# Patient Record
Sex: Male | Born: 1995 | Race: Black or African American | Hispanic: No | Marital: Single | Smoking: Never smoker
Health system: Southern US, Community
[De-identification: ages and names within clinical notes are randomized; demographics above are authoritative.]

## PROBLEM LIST (undated history)

## (undated) ENCOUNTER — Emergency Department (HOSPITAL_COMMUNITY): Payer: Self-pay

## (undated) DIAGNOSIS — J45909 Unspecified asthma, uncomplicated: Secondary | ICD-10-CM

---
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
## 2018-07-28 ENCOUNTER — Other Ambulatory Visit: Payer: Self-pay

## 2018-07-28 ENCOUNTER — Encounter (HOSPITAL_BASED_OUTPATIENT_CLINIC_OR_DEPARTMENT_OTHER): Payer: Self-pay | Admitting: Emergency Medicine

## 2018-07-28 ENCOUNTER — Emergency Department (HOSPITAL_BASED_OUTPATIENT_CLINIC_OR_DEPARTMENT_OTHER)
Admission: EM | Admit: 2018-07-28 | Discharge: 2018-07-28 | Disposition: A | Discharge status: Home / Self Care | Payer: BLUE CROSS/BLUE SHIELD | Attending: Emergency Medicine | Admitting: Emergency Medicine

## 2018-07-28 DIAGNOSIS — L723 Sebaceous cyst: Secondary | ICD-10-CM | POA: Insufficient documentation

## 2018-07-28 DIAGNOSIS — L729 Follicular cyst of the skin and subcutaneous tissue, unspecified: Secondary | ICD-10-CM

## 2018-07-28 DIAGNOSIS — J45909 Unspecified asthma, uncomplicated: Secondary | ICD-10-CM | POA: Insufficient documentation

## 2018-07-28 NOTE — Discharge Instructions (Addendum)
I have listed the information below to the dermatologist.  Please call to be further evaluated.  Return to the ER if you have any new or concerning symptoms like pain over the bumps or fever.

## 2018-07-28 NOTE — ED Triage Notes (Signed)
Pt states he has 2 lumps on his head that need to be checked.

## 2018-07-28 NOTE — ED Notes (Signed)
Pt/family verbalized understanding of discharge instructions.   

## 2018-07-28 NOTE — ED Provider Notes (Signed)
[facility] EMERGENCY DEPARTMENT Provider Note   CSN: [id number] Arrival date & time: 07/28/18  1235     History   Chief Complaint Chief Complaint  Patient presents with  . head pain    HPI Curtis Paul is a 22 y.o. male.  HPI   Curtis Paul is a 22 year old male with a history of asthma who presents to the emergency department for evaluation of 2 bumps on the top of his head.  Patient reports that one of the bumps developed about 3 months ago, and the other developed about 2 weeks ago.  The bumps are painless and not pruritic.  He denies history of this in the past.  States that he occasionally gets headaches which she believes is related to the bumps.  No headache currently.  Denies fevers, chills, drainage, neck pain, bumps or growths elsewhere.  Past Medical History:  Diagnosis Date  . Asthma     There are no active problems to display for this patient.   History reviewed. No pertinent surgical history.      Home Medications    Prior to Admission medications   Not on File    Family History No family history on file.  Social History Social History   Tobacco Use  . Smoking status: Never Smoker  . Smokeless tobacco: Never Used  Substance Use Topics  . Alcohol use: No  . Drug use: Yes    Types: Marijuana     Allergies   Shellfish allergy   Review of Systems Review of Systems  Constitutional: Negative for chills and fever.  Musculoskeletal: Negative for neck pain.  Skin: Negative for color change, rash and wound.       Two painless bumps on the scalp  Neurological: Negative for headaches.     Physical Exam Updated Vital Signs BP (!) 145/76 (BP Location: Left Arm)   Pulse 64   Temp 98.2 F (36.8 C) (Oral)   Resp 16   Ht 5\' 10"  (1.778 m)   Wt 78.9 kg   SpO2 100%   BMI 24.97 kg/m   Physical Exam  Constitutional: He appears well-developed and well-nourished. No distress.  No acute distress.  HENT:  Head: Normocephalic  and atraumatic.  Two 1.5cm raised skin-colored soft nodules on the top of the scalp which are non-tender to the touch. No hair growth overlying the nodules. No drainage, erythema or warmth.   Eyes: Right eye exhibits no discharge. Left eye exhibits no discharge.  Neck: Normal range of motion. Neck supple.  Pulmonary/Chest: Effort normal. No respiratory distress.  Lymphadenopathy:    He has no cervical adenopathy.  Neurological: He is alert. Coordination normal.  Skin: Skin is warm and dry. He is not diaphoretic.  Psychiatric: He has a normal mood and affect. His behavior is normal.  Nursing note and vitals reviewed.        ED Treatments / Results  Labs (all labs ordered are listed, but only abnormal results are displayed) Labs Reviewed - No data to display  EKG None  Radiology No results found.  Procedures Procedures (including critical care time)  Medications Ordered in ED Medications - No data to display   Initial Impression / Assessment and Plan / ED Course  I have reviewed the triage vital signs and the nursing notes.  Pertinent labs & imaging results that were available during my care of the patient were reviewed by me and considered in my medical decision making (see chart for details).  Nodules appear to be epidermoid cysts.  No pain, erythema or warmth to suggest infected cyst or abscess.  Have given patient information to follow-up with dermatology.  Counseled him on return precautions and he agrees and appears reliable.  Final Clinical Impressions(s) / ED Diagnoses   Final diagnoses:  Scalp cyst    ED Discharge Orders    None       Lawrence Marseilles 07/28/18 1418    Mesner, Barbara Cower, MD 07/28/18 [phone]

## 2018-08-02 ENCOUNTER — Other Ambulatory Visit: Payer: Self-pay

## 2018-08-02 ENCOUNTER — Emergency Department (HOSPITAL_BASED_OUTPATIENT_CLINIC_OR_DEPARTMENT_OTHER): Payer: Self-pay

## 2018-08-02 ENCOUNTER — Emergency Department (HOSPITAL_BASED_OUTPATIENT_CLINIC_OR_DEPARTMENT_OTHER)
Admission: EM | Admit: 2018-08-02 | Discharge: 2018-08-02 | Disposition: A | Discharge status: Home / Self Care | Payer: Self-pay | Attending: Emergency Medicine | Admitting: Emergency Medicine

## 2018-08-02 ENCOUNTER — Encounter (HOSPITAL_BASED_OUTPATIENT_CLINIC_OR_DEPARTMENT_OTHER): Payer: Self-pay

## 2018-08-02 DIAGNOSIS — J069 Acute upper respiratory infection, unspecified: Secondary | ICD-10-CM | POA: Insufficient documentation

## 2018-08-02 DIAGNOSIS — J45909 Unspecified asthma, uncomplicated: Secondary | ICD-10-CM | POA: Insufficient documentation

## 2018-08-02 DIAGNOSIS — R05 Cough: Secondary | ICD-10-CM | POA: Insufficient documentation

## 2018-08-02 LAB — GROUP A STREP BY PCR: Group A Strep by PCR: NOT DETECTED

## 2018-08-02 MED ORDER — FLUTICASONE PROPIONATE 50 MCG/ACT NA SUSP: 1.0000 | Freq: Every day | NASAL | 0 refills | Status: DC

## 2018-08-02 MED ORDER — IBUPROFEN 800 MG PO TABS: 800.0000 mg | ORAL_TABLET | Freq: Three times a day (TID) | ORAL | 0 refills | Status: DC | PRN

## 2018-08-02 MED ORDER — BENZONATATE 100 MG PO CAPS: 100.0000 mg | ORAL_CAPSULE | Freq: Three times a day (TID) | ORAL | 0 refills | Status: DC

## 2018-08-02 NOTE — ED Provider Notes (Signed)
[facility] EMERGENCY DEPARTMENT Provider Note   CSN: [id number] Arrival date & time: 08/02/18  1255   History   Chief Complaint Chief Complaint  Patient presents with  . URI    HPI Curtis Paul is a 22 y.o. male with a hx of asthma who presents to the ED with complaints of URI symptoms for the past 2 days.  Patient states he is having congestion, ear pressure, sore throat, productive cough with green mucus sputum.  Symptoms have been fairly constant without specific alleviating or aggravating factors.  He does report subjective fevers over the past 2 days, resolved at present.  No interventions prior to arrival.  Denies dyspnea, wheezing, trouble swallowing, vomiting, or chest pain.  Denies recent tick exposures.  HPI  Past Medical History:  Diagnosis Date  . Asthma     There are no active problems to display for this patient.   History reviewed. No pertinent surgical history.      Home Medications    Prior to Admission medications   Not on File    Family History No family history on file.  Social History Social History   Tobacco Use  . Smoking status: Never Smoker  . Smokeless tobacco: Never Used  Substance Use Topics  . Alcohol use: No  . Drug use: Yes    Types: Marijuana     Allergies   Shellfish allergy   Review of Systems Review of Systems  Constitutional: Positive for fever (Subjective resolved at present.).  HENT: Positive for congestion, ear pain, rhinorrhea and sore throat. Negative for trouble swallowing and voice change.   Respiratory: Positive for cough. Negative for shortness of breath, wheezing and stridor.   Cardiovascular: Negative for chest pain.  Gastrointestinal: Negative for nausea and vomiting.  Skin: Negative for rash.     Physical Exam Updated Vital Signs BP 134/70 (BP Location: Left Arm)   Pulse 70   Temp 98.5 F (36.9 C) (Oral)   Resp 18   Ht 5\' 10"  (1.778 m)   Wt 79.8 kg   SpO2 99%   BMI 25.25 kg/m     Physical Exam  Constitutional: He appears well-developed and well-nourished.  Non-toxic appearance. No distress.  HENT:  Head: Normocephalic and atraumatic.  Right Ear: Tympanic membrane and ear canal normal. Tympanic membrane is not perforated, not erythematous, not retracted and not bulging.  Left Ear: Tympanic membrane and ear canal normal. Tympanic membrane is not perforated, not erythematous, not retracted and not bulging.  Nose: Mucosal edema present. Right sinus exhibits no maxillary sinus tenderness and no frontal sinus tenderness. Left sinus exhibits no maxillary sinus tenderness and no frontal sinus tenderness.  Mouth/Throat: Uvula is midline. Posterior oropharyngeal erythema present. No oropharyngeal exudate.  Posterior oropharynx is symmetric.  Patient is tolerating his own secretions without difficulty.  No trismus.  No drooling.  No hot potato voice.  Submandibular compartment is soft.  Eyes: Conjunctivae are normal. Right eye exhibits no discharge. Left eye exhibits no discharge.  Neck: Normal range of motion. Neck supple. No neck rigidity. No edema and no erythema present.  Cardiovascular: Normal rate and regular rhythm.  Pulmonary/Chest: Effort normal and breath sounds normal. No respiratory distress. He has no wheezes. He has no rhonchi. He has no rales.  Respiration even and unlabored  Abdominal: Soft. He exhibits no distension. There is no tenderness.  Lymphadenopathy:    He has cervical adenopathy (Mild anterior).  Neurological: He is alert.  Clear speech.   Skin: Skin  is warm and dry. No rash noted.  Psychiatric: He has a normal mood and affect. His behavior is normal. Thought content normal.  Nursing note and vitals reviewed.    ED Treatments / Results  Labs (all labs ordered are listed, but only abnormal results are displayed) Labs Reviewed  GROUP A STREP BY PCR    EKG None  Radiology Dg Chest 2 View  Result Date: 08/02/2018 CLINICAL DATA:  Fever,  cough, and sore throat. EXAM: CHEST - 2 VIEW COMPARISON:  Chest x-ray report dated November 09, 2008. FINDINGS: The cardiomediastinal silhouette is normal in size. Normal pulmonary vascularity. No focal consolidation, pleural effusion, or pneumothorax. No acute osseous abnormality. IMPRESSION: Normal chest. Electronically Signed   By: Obie DredgeWilliam T Derry M.D.   On: 08/02/2018 13:56    Procedures Procedures (including critical care time)  Medications Ordered in ED Medications - No data to display   Initial Impression / Assessment and Plan / ED Course  I have reviewed the triage vital signs and the nursing notes.  Pertinent labs & imaging results that were available during my care of the patient were reviewed by me and considered in my medical decision making (see chart for details).    Patient presents with URI type symptoms.  Patient is nontoxic appearing, in no apparent distress, vitals are WNL. Patient is afebrile in the ED, lungs are CTA, CXR negative for infiltrate, doubt pneumonia. There is no wheezing or signs of respiratory distress. Sxs onset < 7 days, afebrile, no sinus tenderness, doubt acute bacterial sinusitis. Strep negative, no evidence of RPA/PTA. No evidence of AOM on exam. No meningeal signs. No history components or rashes to raise concern for tic borne illness. Suspect viral vs allergic etiology at this time and will treat supportively with Ibuprofen, Flonase, and Tessalon. I discussed results, treatment plan, need for PCP follow-up, and return precautions with the patient. Provided opportunity for questions, patient confirmed understanding and is in agreement with plan.    Final Clinical Impressions(s) / ED Diagnoses   Final diagnoses:  URI with cough and congestion    ED Discharge Orders         Ordered    ibuprofen (ADVIL,MOTRIN) 800 MG tablet  Every 8 hours PRN     08/02/18 1429    fluticasone (FLONASE) 50 MCG/ACT nasal spray  Daily     08/02/18 1429    benzonatate  (TESSALON) 100 MG capsule  Every 8 hours     08/02/18 1429           Terryl Niziolek, Pleas KochSamantha R, PA-C 08/02/18 1430    Benjiman CorePickering, Nathan, MD 08/02/18 1546

## 2018-08-02 NOTE — Discharge Instructions (Signed)
You were seen in the emergency today for upper respiratory symptoms, your strep test was negative, your chest x-ray was normal, we suspect your symptoms are related to allergies or a virus at this time.  I have prescribed you multiple medications to treat your symptoms.   -Flonase to be used 1 spray in each nostril daily.  This medication is used to treat your congestion.  -Tessalon can be taken once every 8 hours as needed.  This medication is used to treat your cough.  -Ibuprofen to be taken once every 8 hours as needed for pain. Please take this medicine with food as it can cause stomach upset and at worst stomach bleeding. Do not take other NSAIDs such as motrin, aleve, advil, naproxen, mobic, etc as they are similar. You make take tylenol per over the counter dosing with this medicine safely.  We have prescribed you new medication(s) today. Discuss the medications prescribed today with your pharmacist as they can have adverse effects and interactions with your other medicines including over the counter and prescribed medications. Seek medical evaluation if you start to experience new or abnormal symptoms after taking one of these medicines, seek care immediately if you start to experience difficulty breathing, feeling of your throat closing, facial swelling, or rash as these could be indications of a more serious allergic reaction  You will need to follow-up with your primary care provider in 1 week if your symptoms have not improved.  If you do not have a primary care provider one is provided in your discharge instructions.  Return to the emergency department for any new or worsening symptoms including but not limited to persistent fever for 5 days, difficulty breathing, chest pain, rashes, passing out, or any other concerns.

## 2018-08-02 NOTE — ED Triage Notes (Signed)
C/o URI sx x 2 days-NAD-steady gait 

## 2018-10-15 ENCOUNTER — Other Ambulatory Visit: Payer: Self-pay

## 2018-10-15 ENCOUNTER — Emergency Department (HOSPITAL_BASED_OUTPATIENT_CLINIC_OR_DEPARTMENT_OTHER)
Admission: EM | Admit: 2018-10-15 | Discharge: 2018-10-15 | Disposition: A | Discharge status: Home / Self Care | Payer: Self-pay | Attending: Emergency Medicine | Admitting: Emergency Medicine

## 2018-10-15 ENCOUNTER — Encounter (HOSPITAL_BASED_OUTPATIENT_CLINIC_OR_DEPARTMENT_OTHER): Payer: Self-pay | Admitting: *Deleted

## 2018-10-15 DIAGNOSIS — Z79899 Other long term (current) drug therapy: Secondary | ICD-10-CM | POA: Insufficient documentation

## 2018-10-15 DIAGNOSIS — L723 Sebaceous cyst: Secondary | ICD-10-CM | POA: Insufficient documentation

## 2018-10-15 DIAGNOSIS — L729 Follicular cyst of the skin and subcutaneous tissue, unspecified: Secondary | ICD-10-CM

## 2018-10-15 DIAGNOSIS — F17200 Nicotine dependence, unspecified, uncomplicated: Secondary | ICD-10-CM | POA: Insufficient documentation

## 2018-10-15 DIAGNOSIS — J45909 Unspecified asthma, uncomplicated: Secondary | ICD-10-CM | POA: Insufficient documentation

## 2018-10-15 DIAGNOSIS — Z23 Encounter for immunization: Secondary | ICD-10-CM | POA: Insufficient documentation

## 2018-10-15 DIAGNOSIS — R42 Dizziness and giddiness: Secondary | ICD-10-CM | POA: Insufficient documentation

## 2018-10-15 LAB — BASIC METABOLIC PANEL
Anion gap: 4 — ABNORMAL LOW (ref 5–15)
BUN: 11 mg/dL (ref 6–20)
CALCIUM: 9 mg/dL (ref 8.9–10.3)
CO2: 29 mmol/L (ref 22–32)
CREATININE: 0.66 mg/dL (ref 0.61–1.24)
Chloride: 104 mmol/L (ref 98–111)
GFR calc Af Amer: >60 mL/min (ref >60)
GLUCOSE: 88 mg/dL (ref 70–99)
Potassium: 3.6 mmol/L (ref 3.5–5.1)
Sodium: 137 mmol/L (ref 135–145)

## 2018-10-15 LAB — CBC WITH DIFFERENTIAL/PLATELET
Abs Immature Granulocytes: 0.01 10*3/uL (ref 0.00–0.07)
BASOS PCT: 0 %
Basophils Absolute: 0 10*3/uL (ref 0.0–0.1)
EOS ABS: 0.3 10*3/uL (ref 0.0–0.5)
EOS PCT: 3 %
HEMATOCRIT: 41.8 % (ref 39.0–52.0)
Hemoglobin: 13.5 g/dL (ref 13.0–17.0)
IMMATURE GRANULOCYTES: 0 %
LYMPHS ABS: 1.7 10*3/uL (ref 0.7–4.0)
Lymphocytes Relative: 22 %
MCH: 26.8 pg (ref 26.0–34.0)
MCHC: 32.3 g/dL (ref 30.0–36.0)
MCV: 82.9 fL (ref 80.0–100.0)
MONOS PCT: 8 %
Monocytes Absolute: 0.6 10*3/uL (ref 0.1–1.0)
NEUTROS PCT: 67 %
Neutro Abs: 5 10*3/uL (ref 1.7–7.7)
PLATELETS: 160 10*3/uL (ref 150–400)
RBC: 5.04 MIL/uL (ref 4.22–5.81)
RDW: 12.9 % (ref 11.5–15.5)
WBC: 7.6 10*3/uL (ref 4.0–10.5)
nRBC: 0 % (ref 0.0–0.2)

## 2018-10-15 MED ORDER — TETANUS-DIPHTH-ACELL PERTUSSIS 5-2.5-18.5 LF-MCG/0.5 IM SUSP
0.5000 mL | Freq: Once | INTRAMUSCULAR | Status: AC
Start: 2018-10-15 — End: 2018-10-15
  Administered 2018-10-15: 0.5 mL via INTRAMUSCULAR
  Filled 2018-10-15: qty 0.5

## 2018-10-15 MED ORDER — NAPROXEN 500 MG PO TABS: 500.0000 mg | ORAL_TABLET | Freq: Two times a day (BID) | ORAL | 0 refills | Status: DC

## 2018-10-15 MED ORDER — LIDOCAINE-EPINEPHRINE (PF) 2 %-1:200000 IJ SOLN
INTRAMUSCULAR | Status: AC
  Filled 2018-10-15: qty 10

## 2018-10-15 MED ORDER — LIDOCAINE-EPINEPHRINE (PF) 2 %-1:200000 IJ SOLN
20.0000 mL | Freq: Once | INTRAMUSCULAR | Status: AC
  Administered 2018-10-15: 20 mL
  Filled 2018-10-15: qty 20

## 2018-10-15 NOTE — ED Provider Notes (Signed)
[facility] EMERGENCY DEPARTMENT Provider Note   CSN: [id number] Arrival date & time: 10/15/18  4098     History   Chief Complaint Chief Complaint  Patient presents with  . Body aches; bump to head; light headed    HPI Curtis Paul is a 22 y.o. male presenting for evaluation of scalp cysts and body aches.  Patient states he has been having issues with bumps on his scalp for the past several months.  This is the third time he has been in the ER for this.  He was last seen 5 days ago, where 2 of the bumps were drained.  Patient states he has been told there is cysts, and he will need to follow-up with dermatology but he has not done so yet.  Patient states that since his last visit, the bumps are more swollen and tender.  Additionally, he reports in the last week he has been having body aches.  Constant, nothing makes it better or worse.  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  He has not taken anything for pain including Tylenol or ibuprofen.  He has no medical problems, takes no medications daily.  He does not know when his last tetanus shot was.  He is not on any blood thinners. Additionally, patient states he has intermittent lightheadedness, this is usually after a box that he is unloading from a truck hit him on the head.  It lasts for several minutes before resolving.  He never has lightheadedness without head trauma.  He denies loss of consciousness, headaches, vision changes. He denies numbness, tingling, slurred speech.  Pt denies fevers, chills, N/V, st, cough, CP, urinary sxs, or abnromal BMs.   HPI  Past Medical History:  Diagnosis Date  . Asthma     There are no active problems to display for this patient.   History reviewed. No pertinent surgical history.      Home Medications    Prior to Admission medications   Medication Sig Start Date End Date Taking? Authorizing Provider  benzonatate (TESSALON) 100 MG capsule Take 1 capsule (100  mg total) by mouth every 8 (eight) hours. 08/02/18   Petrucelli, Samantha R, PA-C  fluticasone (FLONASE) 50 MCG/ACT nasal spray Place 1 spray into both nostrils daily. 08/02/18   Petrucelli, Samantha R, PA-C  ibuprofen (ADVIL,MOTRIN) 800 MG tablet Take 1 tablet (800 mg total) by mouth every 8 (eight) hours as needed. 08/02/18   Petrucelli, Samantha R, PA-C  naproxen (NAPROSYN) 500 MG tablet Take 1 tablet (500 mg total) by mouth 2 (two) times daily with a meal. 10/15/18   Caralina Nop, PA-C    Family History History reviewed. No pertinent family history.  Social History Social History   Tobacco Use  . Smoking status: Current Every Day Smoker  . Smokeless tobacco: Never Used  . Tobacco comment: Black and Mouse  Substance Use Topics  . Alcohol use: No  . Drug use: Yes    Types: Marijuana     Allergies   Shellfish allergy   Review of Systems Review of Systems  Musculoskeletal: Positive for myalgias.  Skin:       Lesions on scalp  Neurological: Positive for light-headedness (with head trauma).  All other systems reviewed and are negative.    Physical Exam Updated Vital Signs BP (!) 150/83 (BP Location: Right Arm)   Pulse (!) 54   Temp 98.6 F (37 C) (Oral)   Resp 16   Ht 5'  10" (1.778 m)   Wt 65.8 kg   SpO2 100%   BMI 20.81 kg/m   Physical Exam  Constitutional: He is oriented to person, place, and time. He appears well-developed and well-nourished. No distress.  Sitting comfortably in the bed in no acute distress  HENT:  Head: Normocephalic and atraumatic.    3 soft and fluctuant areas on the scalp, consistent with previous pictures.  No hair growth overlying the cysts.  Clear fluid can be expressed from the top lesion.  Signs of previous I&D's, but no different drainage.  No erythema or warmth.  No significant tenderness.  Eyes: Pupils are equal, round, and reactive to light. Conjunctivae and EOM are normal.  Neck: Normal range of motion. Neck supple.    Cardiovascular: Normal rate, regular rhythm and intact distal pulses.  Pulmonary/Chest: Effort normal and breath sounds normal. No respiratory distress. He has no wheezes.  Abdominal: Soft. He exhibits no distension. There is no tenderness.  Musculoskeletal: Normal range of motion. He exhibits no edema, tenderness or deformity.  No focal tenderness, swelling or deformity. Strength intact x4, sensation intact x4  Neurological: He is alert and oriented to person, place, and time. No sensory deficit.  Skin: Skin is warm and dry. Capillary refill takes less than 2 seconds.  Psychiatric: He has a normal mood and affect.  Nursing note and vitals reviewed.    ED Treatments / Results  Labs (all labs ordered are listed, but only abnormal results are displayed) Labs Reviewed  BASIC METABOLIC PANEL - Abnormal; Notable for the following components:      Result Value   Anion gap 4 (*)    All other components within normal limits  CBC WITH DIFFERENTIAL/PLATELET    EKG None  Radiology No results found.  Procedures .Marland KitchenIncision and Drainage Date/Time: 10/15/2018 1:28 PM Performed by: Alveria Apley, PA-C Authorized by: Alveria Apley, PA-C   Consent:    Consent obtained:  Verbal   Consent given by:  Patient   Risks discussed:  Bleeding, incomplete drainage, pain and infection Location:    Type:  Cyst   Size:  1x1 cm   Location:  Head   Head location:  Scalp (crown) Pre-procedure details:    Skin preparation:  Chloraprep Anesthesia (see MAR for exact dosages):    Anesthesia method:  Local infiltration   Local anesthetic:  Lidocaine 2% WITH epi Procedure type:    Complexity:  Simple Procedure details:    Incision types:  Single straight   Incision depth:  Dermal   Scalpel blade:  11   Wound management:  Probed and deloculated   Drainage:  Serosanguinous   Drainage amount:  Scant   Wound treatment:  Wound left open   Packing materials:  None Post-procedure details:     Patient tolerance of procedure:  Tolerated well, no immediate complications .Marland KitchenIncision and Drainage Date/Time: 10/15/2018 1:28 PM Performed by: Alveria Apley, PA-C Authorized by: Alveria Apley, PA-C   Consent:    Consent obtained:  Verbal   Consent given by:  Patient   Risks discussed:  Bleeding, incomplete drainage, infection and pain Location:    Type:  Cyst   Size:  0.5x0.5 cm   Location:  Head   Head location:  Scalp (L parieto-occipital head) Pre-procedure details:    Skin preparation:  Chloraprep Anesthesia (see MAR for exact dosages):    Anesthesia method:  Local infiltration   Local anesthetic:  Lidocaine 2% WITH epi Procedure type:    Complexity:  Simple  Procedure details:    Incision types:  Single straight   Incision depth:  Dermal   Scalpel blade:  11   Wound management:  Probed and deloculated   Drainage:  Serosanguinous   Drainage amount:  Scant   Wound treatment:  Wound left open   Packing materials:  None Post-procedure details:    Patient tolerance of procedure:  Tolerated well, no immediate complications .Marland Kitchen.Incision and Drainage Date/Time: 10/15/2018 1:29 PM Performed by: Alveria Apleyaccavale, Unnamed Zeien, PA-C Authorized by: Alveria Apleyaccavale, Kaysie Michelini, PA-C   Consent:    Consent obtained:  Verbal   Consent given by:  Patient   Risks discussed:  Bleeding, incomplete drainage, infection and pain Location:    Type:  Cyst   Size:  1.5x1 cm   Location:  Head   Head location:  Scalp (L occipital head) Pre-procedure details:    Skin preparation:  Chloraprep Anesthesia (see MAR for exact dosages):    Anesthesia method:  Local infiltration   Local anesthetic:  Lidocaine 2% WITH epi Procedure type:    Complexity:  Simple Procedure details:    Incision types:  Single straight   Incision depth:  Dermal   Scalpel blade:  11   Wound management:  Probed and deloculated   Drainage:  Serosanguinous   Drainage amount:  Moderate   Wound treatment:  Wound left open    Packing materials:  None Post-procedure details:    Patient tolerance of procedure:  Tolerated well, no immediate complications   (including critical care time)  Medications Ordered in ED Medications  lidocaine-EPINEPHrine (XYLOCAINE W/EPI) 2 %-1:200000 (PF) injection (has no administration in time range)  lidocaine-EPINEPHrine (XYLOCAINE W/EPI) 2 %-1:200000 (PF) injection 20 mL (20 mLs Infiltration Given by Other 10/15/18 1031)  Tdap (BOOSTRIX) injection 0.5 mL (0.5 mLs Intramuscular Given 10/15/18 1032)     Initial Impression / Assessment and Plan / ED Course  I have reviewed the triage vital signs and the nursing notes.  Pertinent labs & imaging results that were available during my care of the patient were reviewed by me and considered in my medical decision making (see chart for details).     Patient presenting for evaluation of cysts on his head, body aches, and lightheadedness.  Physical exam reassuring, no obvious signs of infection.  He is afebrile not tachycardic.  Appears nontoxic.  Lesions on the head not appear infected.  They are soft and nontender.  Lightheadedness is associated with head trauma, no lightheadedness now. Discussed with pt that repetitive head trauma can be dangerous over long-term, and he needs to find a way to not sustain head trauma.  Currently without neurologic deficits, I do not believe he needs CT scan at this time.  As patient is having generous body aches, will obtain basic labs to assess for hemoglobin and electrolyte stability.  Low suspicion for infection, as patient without infectious symptoms.  I and D performed as described above.  No purjob.  Encouraged follow-up with dermatology.  Encourulent drainage, normal serosanguineous.  Doubt infected cysts.  Discussed with patient that he needs to follow-up with dermatology for further management. Labs reassuring, no leukocytosis.  hgb and electrolytes stable.  Myalgias may be due to likely demanding aged  Tylenol and ibuprofen for pain and swelling.  At this time, patient received discharge.  Return precautions given.  Patient states he understands and agrees to plan.   Final Clinical Impressions(s) / ED Diagnoses   Final diagnoses:  Scalp cyst    ED Discharge Orders  Ordered    naproxen (NAPROSYN) 500 MG tablet  2 times daily with meals     10/15/18 1135           Janye Maynor, PA-C 10/15/18 1330    Gwyneth Sprout, MD 10/15/18 [phone]

## 2018-10-15 NOTE — ED Notes (Signed)
Patient has 3 bumps to his head. 1 bump to top of his head is draining a small amount of clear drainage.  Bumps is tender.  As per conversation, patient stated that his mom also have the same bumps that he have.

## 2018-10-15 NOTE — ED Notes (Signed)
ED Provider at bedside. 

## 2018-10-15 NOTE — ED Triage Notes (Signed)
Generalized body aches for a week; cysts to head, [facility] and has been light headed for a week intermittently.

## 2018-10-15 NOTE — Discharge Instructions (Addendum)
For pain and swelling, take naproxen 2 times a day with meals.  Do not take other anti-inflammatories at the same time (Advil, Motrin, naproxen, Aleve). You may supplement with Tylenol if you need further pain control. Use ice packs to help with swelling.  Wash the areas on your head daily with soap and water.  It may continue to drain over the next several days, keep covered while draining. It is very important to follow-up with dermatology for further management of the spots in your head. 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  It is dangerous to have repetitive head trauma. Return to the emergency room with any new, worsening, concerning symptoms.

## 2018-12-18 ENCOUNTER — Emergency Department (HOSPITAL_BASED_OUTPATIENT_CLINIC_OR_DEPARTMENT_OTHER)
Admission: EM | Admit: 2018-12-18 | Discharge: 2018-12-18 | Disposition: A | Discharge status: Home / Self Care | Payer: Self-pay | Attending: Emergency Medicine | Admitting: Emergency Medicine

## 2018-12-18 ENCOUNTER — Other Ambulatory Visit: Payer: Self-pay

## 2018-12-18 ENCOUNTER — Emergency Department (HOSPITAL_BASED_OUTPATIENT_CLINIC_OR_DEPARTMENT_OTHER): Payer: Self-pay

## 2018-12-18 ENCOUNTER — Encounter (HOSPITAL_BASED_OUTPATIENT_CLINIC_OR_DEPARTMENT_OTHER): Payer: Self-pay | Admitting: Emergency Medicine

## 2018-12-18 DIAGNOSIS — R05 Cough: Secondary | ICD-10-CM | POA: Insufficient documentation

## 2018-12-18 DIAGNOSIS — F17200 Nicotine dependence, unspecified, uncomplicated: Secondary | ICD-10-CM | POA: Insufficient documentation

## 2018-12-18 DIAGNOSIS — J45909 Unspecified asthma, uncomplicated: Secondary | ICD-10-CM | POA: Insufficient documentation

## 2018-12-18 DIAGNOSIS — R0602 Shortness of breath: Secondary | ICD-10-CM | POA: Insufficient documentation

## 2018-12-18 DIAGNOSIS — R0789 Other chest pain: Secondary | ICD-10-CM | POA: Insufficient documentation

## 2018-12-18 MED ORDER — IPRATROPIUM-ALBUTEROL 0.5-2.5 (3) MG/3ML IN SOLN
3.0000 mL | Freq: Four times a day (QID) | RESPIRATORY_TRACT | Status: DC
  Administered 2018-12-18: 3 mL via RESPIRATORY_TRACT
  Filled 2018-12-18: qty 3

## 2018-12-18 MED ORDER — ALBUTEROL SULFATE HFA 108 (90 BASE) MCG/ACT IN AERS: 1.0000 | INHALATION_SPRAY | Freq: Four times a day (QID) | RESPIRATORY_TRACT | 0 refills | Status: DC | PRN

## 2018-12-18 NOTE — ED Triage Notes (Signed)
Pt here with SOB after smoking his black and mild today.

## 2018-12-18 NOTE — Discharge Instructions (Signed)
Chest x-ray was normal.  EKG was normal.  We suspect that your symptoms were asthma related.  Please do not smoke tobacco products as this can aggravate your asthma.  We are sending you home with an inhaler to use as needed for shortness of breath or wheezing.  Use 1 to 2 puffs every 4-6 hours.  We have prescribed you new medication(s) today. Discuss the medications prescribed today with your pharmacist as they can have adverse effects and interactions with your other medicines including over the counter and prescribed medications. Seek medical evaluation if you start to experience new or abnormal symptoms after taking one of these medicines, seek care immediately if you start to experience difficulty breathing, feeling of your throat closing, facial swelling, or rash as these could be indications of a more serious allergic reaction  Follow with primary care within 1 week.  If you do not have a primary care provider please see the attached phone number circled.  Return to the ER for new or worsening symptoms or any other concerns.

## 2018-12-18 NOTE — ED Provider Notes (Signed)
[facility] EMERGENCY DEPARTMENT Provider Note   CSN: [id number] Arrival date & time: 12/18/18  1255     History   Chief Complaint Chief Complaint  Patient presents with  . Shortness of Breath    HPI Curtis Paul is a 23 y.o. male with a hx of tobacco abuse and asthma who presents to the ED with complaints of dyspnea that started shortly PTA.  Patient states he had a dry cough with some chest tightness over the past few days.  He states that today he smoked a black and mild which acutely worsened his chest tightness, cough, and caused him to develop shortness of breath.  States that since arrival to the emergency department his symptoms are overall improving.  He does not really feel short of breath anymore but remains with some mild chest tightness.  Other than smoking the back and mild no specific alleviating or aggravating factors.  No meds tried prior to arrival.  He does have a history of asthma and does not have an inhaler to use at home.  He denies fever, chills, lightheadedness, dizziness, nausea, vomiting, diaphoresis, or syncope.  HPI  Past Medical History:  Diagnosis Date  . Asthma     There are no active problems to display for this patient.   History reviewed. No pertinent surgical history.      Home Medications    Prior to Admission medications   Medication Sig Start Date End Date Taking? Authorizing Provider  benzonatate (TESSALON) 100 MG capsule Take 1 capsule (100 mg total) by mouth every 8 (eight) hours. 08/02/18   Gustavia Carie R, PA-C  fluticasone (FLONASE) 50 MCG/ACT nasal spray Place 1 spray into both nostrils daily. 08/02/18   Daryel Kenneth R, PA-C  ibuprofen (ADVIL,MOTRIN) 800 MG tablet Take 1 tablet (800 mg total) by mouth every 8 (eight) hours as needed. 08/02/18   Tracey Stewart R, PA-C  naproxen (NAPROSYN) 500 MG tablet Take 1 tablet (500 mg total) by mouth 2 (two) times daily with a meal. 10/15/18   Caccavale, Sophia,  PA-C    Family History History reviewed. No pertinent family history.  Social History Social History   Tobacco Use  . Smoking status: Current Every Day Smoker  . Smokeless tobacco: Never Used  . Tobacco comment: Black and Mouse  Substance Use Topics  . Alcohol use: No  . Drug use: Yes    Types: Marijuana     Allergies   Shellfish allergy   Review of Systems Review of Systems  Constitutional: Negative for chills, diaphoresis and fever.  HENT: Negative for congestion and sore throat.   Respiratory: Positive for cough, chest tightness and shortness of breath.   Cardiovascular: Negative for palpitations and leg swelling.  Gastrointestinal: Negative for abdominal pain and vomiting.  Neurological: Negative for syncope.     Physical Exam Updated Vital Signs BP (!) 142/74   Pulse 74   Temp 98.2 F (36.8 C) (Oral)   Resp 16   Ht 5\' 10"  (1.778 m)   Wt 68 kg   SpO2 100%   BMI 21.52 kg/m   Physical Exam Vitals signs and nursing note reviewed.  Constitutional:      General: He is not in acute distress.    Appearance: He is well-developed. He is not toxic-appearing.  HENT:     Head: Normocephalic and atraumatic.  Eyes:     General:        Right eye: No discharge.  Left eye: No discharge.     Conjunctiva/sclera: Conjunctivae normal.  Neck:     Musculoskeletal: Neck supple.  Cardiovascular:     Rate and Rhythm: Normal rate and regular rhythm.  Pulmonary:     Effort: Pulmonary effort is normal. No respiratory distress.     Breath sounds: Normal breath sounds. No decreased breath sounds, wheezing, rhonchi or rales.  Chest:     Chest wall: No deformity, tenderness or crepitus.  Abdominal:     General: There is no distension.     Palpations: Abdomen is soft.     Tenderness: There is no abdominal tenderness.  Skin:    General: Skin is warm and dry.     Findings: No rash.  Neurological:     Mental Status: He is alert.     Comments: Clear speech.     Psychiatric:        Behavior: Behavior normal.    ED Treatments / Results  Labs (all labs ordered are listed, but only abnormal results are displayed) Labs Reviewed - No data to display  EKG EKG Interpretation  Date/Time:  Wednesday December 18 2018 13:05:24 EST Ventricular Rate:  71 PR Interval:  142 QRS Duration: 98 QT Interval:  364 QTC Calculation: 395 R Axis:   87 Text Interpretation:  Normal sinus rhythm Normal ECG When compared to prior, no significant changes seen.  No STEMI Confirmed by Theda Belfastegeler, Chris ([id number]) on 12/18/2018 3:22:13 PM   Radiology Dg Chest 2 View  Result Date: 12/18/2018 CLINICAL DATA:  Shortness of breath EXAM: CHEST - 2 VIEW COMPARISON:  August 02, 2018 FINDINGS: Lungs are clear. The heart size and pulmonary vascularity are normal. No adenopathy. No bone lesions. IMPRESSION: No edema or consolidation. Electronically Signed   By: Bretta BangWilliam  Woodruff III M.D.   On: 12/18/2018 13:15    Procedures Procedures (including critical care time)  Medications Ordered in ED Medications - No data to display   Initial Impression / Assessment and Plan / ED Course  I have reviewed the triage vital signs and the nursing notes.  Pertinent labs & imaging results that were available during my care of the patient were reviewed by me and considered in my medical decision making (see chart for details).   Patient presents to the ED with chest tightness/dry cough x 3 days which worsened w/ development of dyspnea after smoking a black and mild today shortly PTA. Nontoxic appearing, in no apparent distress, vitals WNL with the exception of elevated BP- doubt HTN emergency. Exam with clear lungs. CXR per triage negative for infiltrate, pneumothorax, effusion, edema. EKG per triage without arrhythmia or ischemic changes. No EKG changes or hx components to raise concern for pericarditis. Lungs clear without wheezing, but with hx of asthma trial of duoneb- patient reports resolution  in sxs. Do not feel this is true asthma exacerbation requiring steroids, but will provide patient prescription for inhaler as he does not have one at home for PRN use. PCP follow up. I discussed results, treatment plan, need for follow-up, and return precautions with the patient. Provided opportunity for questions, patient confirmed understanding and is in agreement with plan.   Final Clinical Impressions(s) / ED Diagnoses   Final diagnoses:  Shortness of breath    ED Discharge Orders         Ordered    albuterol (PROVENTIL HFA;VENTOLIN HFA) 108 (90 Base) MCG/ACT inhaler  Every 6 hours PRN     12/18/18 1531  Cherly Andersonetrucelli, Koltyn Kelsay R, [place]PA-C 12/18/18 [id number]    Tegeler, Canary Brimhristopher J, MD 12/18/18 [phone]

## 2019-07-24 ENCOUNTER — Encounter (HOSPITAL_BASED_OUTPATIENT_CLINIC_OR_DEPARTMENT_OTHER): Payer: Self-pay | Admitting: Emergency Medicine

## 2019-07-24 ENCOUNTER — Emergency Department (HOSPITAL_BASED_OUTPATIENT_CLINIC_OR_DEPARTMENT_OTHER)
Admission: EM | Admit: 2019-07-24 | Discharge: 2019-07-24 | Disposition: A | Discharge status: Home / Self Care | Payer: Self-pay | Attending: Emergency Medicine | Admitting: Emergency Medicine

## 2019-07-24 ENCOUNTER — Other Ambulatory Visit: Payer: Self-pay

## 2019-07-24 DIAGNOSIS — Z87891 Personal history of nicotine dependence: Secondary | ICD-10-CM | POA: Insufficient documentation

## 2019-07-24 DIAGNOSIS — J029 Acute pharyngitis, unspecified: Secondary | ICD-10-CM | POA: Insufficient documentation

## 2019-07-24 DIAGNOSIS — Z20828 Contact with and (suspected) exposure to other viral communicable diseases: Secondary | ICD-10-CM | POA: Insufficient documentation

## 2019-07-24 DIAGNOSIS — J45909 Unspecified asthma, uncomplicated: Secondary | ICD-10-CM | POA: Insufficient documentation

## 2019-07-24 LAB — GROUP A STREP BY PCR: Group A Strep by PCR: NOT DETECTED

## 2019-07-24 NOTE — ED Triage Notes (Signed)
Sore throat since last night.  Denies any other sx.  Denies fever.

## 2019-07-24 NOTE — Discharge Instructions (Addendum)
You were evaluated in the Emergency Department and after careful evaluation, we did not find any emergent condition requiring admission or further testing in the hospital.  Your symptoms today seem to be due to a virus.  You tested negative for strep throat.  We recommend Tylenol or ibuprofen at home for discomfort.  It is unlikely that your symptoms could be caused by the coronavirus.  We tested you for the coronavirus here in the emergency department, we recommend home isolation/quarantine until your test results.  If positive, we recommend continued home isolation according to [facility] recommendations.  Please return to the Emergency Department if you experience any worsening of your condition.  We encourage you to follow up with a primary care provider.  Thank you for allowing [place] to be a part of your care.

## 2019-07-24 NOTE — ED Notes (Signed)
ED Provider at bedside. 

## 2019-07-24 NOTE — ED Provider Notes (Signed)
[facility] Emergency Department Provider Note MRN:  [id number]  Arrival date & time: 07/24/19     Chief Complaint   Sore Throat   History of Present Illness   Curtis Paul is a 23 y.o. year-old male with a history of asthma presenting to the ED with chief complaint of sore throat.  1 day of sore throat, thinks he has strep throat.  Denies fever, no cough, no shortness of breath, no chest pain.  Symptoms are constant, moderate in severity, worse with swallowing but able to eat and drink today.  Review of Systems  A problem-focused ROS was performed. Positive for sore throat.  Patient denies fever, cough.  Patient's Health History    Past Medical History:  Diagnosis Date  . Asthma     History reviewed. No pertinent surgical history.  No family history on file.  Social History   Socioeconomic History  . Marital status: Single    Spouse name: Not on file  . Number of children: Not on file  . Years of education: Not on file  . Highest education level: Not on file  Occupational History  . Not on file  Social Needs  . Financial resource strain: Not on file  . Food insecurity    Worry: Not on file    Inability: Not on file  . Transportation needs    Medical: Not on file    Non-medical: Not on file  Tobacco Use  . Smoking status: Former Research scientist (life sciences)  . Smokeless tobacco: Never Used  . Tobacco comment: Black and Mouse  Substance and Sexual Activity  . Alcohol use: No  . Drug use: Yes    Types: Marijuana    Comment: last smoked 2 days ago  . Sexual activity: Yes  Lifestyle  . Physical activity    Days per week: Not on file    Minutes per session: Not on file  . Stress: Not on file  Relationships  . Social Herbalist on phone: Not on file    Gets together: Not on file    Attends religious service: Not on file    Active member of club or organization: Not on file    Attends meetings of clubs or organizations: Not on file   Relationship status: Not on file  . Intimate partner violence    Fear of current or ex partner: Not on file    Emotionally abused: Not on file    Physically abused: Not on file    Forced sexual activity: Not on file  Other Topics Concern  . Not on file  Social History Narrative  . Not on file     Physical Exam  Vital Signs and Nursing Notes reviewed Vitals:   07/24/19 0844  BP: 132/78  Pulse: 61  Resp: 18  Temp: 98.4 F (36.9 C)  SpO2: 100%    CONSTITUTIONAL: Well-appearing, NAD NEURO:  Alert and oriented x 3, no focal deficits EYES:  eyes equal and reactive ENT/NECK:  no LAD, no JVD; mild erythema to the posterior oropharynx, no asymmetry CARDIO: Regular rate, well-perfused, normal S1 and S2 PULM:  CTAB no wheezing or rhonchi GI/GU:  normal bowel sounds, non-distended, non-tender MSK/SPINE:  No gross deformities, no edema SKIN:  no rash, atraumatic PSYCH:  Appropriate speech and behavior  Diagnostic and Interventional Summary    Labs Reviewed  GROUP A STREP BY PCR  NOVEL CORONAVIRUS, NAA (HOSPITAL ORDER, SEND-OUT TO REF LAB)    No  orders to display    Medications - No data to display   Procedures Critical Care  ED Course and Medical Decision Making  I have reviewed the triage vital signs and the nursing notes.  Pertinent labs & imaging results that were available during my care of the patient were reviewed by me and considered in my medical decision making (see below for details).  Viral pharyngitis versus strep throat versus less likely coronavirus.  Normal vital signs, clear lungs, well-appearing, no increased work of breathing, no signs of PTA or RPA.  Will swab and then discharge with home quarantine precautions until his test result for coronavirus comes back.  Rapid strep is negative, appropriate for discharge with symptomatic management.  Elmer SowMichael M. Pilar PlateBero, MD [facility] [email or web address]  Final  Clinical Impressions(s) / ED Diagnoses     ICD-10-CM   1. Sore throat  J02.9     ED Discharge Orders    None        Discharge Instructions     You were evaluated in the Emergency Department and after careful evaluation, we did not find any emergent condition requiring admission or further testing in the hospital.  Your symptoms today seem to be due to a virus.  You tested negative for strep throat.  We recommend Tylenol or ibuprofen at home for discomfort.  It is unlikely that your symptoms could be caused by the coronavirus.  We tested you for the coronavirus here in the emergency department, we recommend home isolation/quarantine until your test results.  If positive, we recommend continued home isolation according to [facility] recommendations.  Please return to the Emergency Department if you experience any worsening of your condition.  We encourage you to follow up with a primary care provider.  Thank you for allowing us to be a part of your care.       Sabas SousBero, Shelly Shoultz M, MD 07/24/19 [phone]

## 2019-07-25 LAB — NOVEL CORONAVIRUS, NAA (HOSP ORDER, SEND-OUT TO REF LAB; TAT 18-24 HRS): SARS-CoV-2, NAA: NOT DETECTED

## 2019-07-28 ENCOUNTER — Telehealth: Payer: Self-pay | Admitting: General Practice

## 2019-07-28 NOTE — Telephone Encounter (Signed)
Negative COVID results given. Patient results "NOT Detected." Caller expressed understanding. ° °

## 2019-09-03 ENCOUNTER — Emergency Department (HOSPITAL_COMMUNITY)
Admission: EM | Admit: 2019-09-03 | Discharge: 2019-09-04 | Discharge status: Against Medical Advice | Payer: Self-pay | Attending: Emergency Medicine | Admitting: Emergency Medicine

## 2019-09-03 ENCOUNTER — Other Ambulatory Visit: Payer: Self-pay

## 2019-09-03 ENCOUNTER — Emergency Department (HOSPITAL_COMMUNITY): Payer: Self-pay

## 2019-09-03 DIAGNOSIS — Z5321 Procedure and treatment not carried out due to patient leaving prior to being seen by health care provider: Secondary | ICD-10-CM | POA: Insufficient documentation

## 2019-09-03 LAB — BASIC METABOLIC PANEL
Anion gap: 11 (ref 5–15)
BUN: 7 mg/dL (ref 6–20)
CO2: 24 mmol/L (ref 22–32)
Calcium: 9 mg/dL (ref 8.9–10.3)
Chloride: 104 mmol/L (ref 98–111)
Creatinine, Ser: 0.84 mg/dL (ref 0.61–1.24)
GFR calc Af Amer: >60 mL/min (ref >60)
GFR calc non Af Amer: >60 mL/min (ref >60)
Glucose, Bld: 114 mg/dL — ABNORMAL HIGH (ref 70–99)
Potassium: 3.7 mmol/L (ref 3.5–5.1)
Sodium: 139 mmol/L (ref 135–145)

## 2019-09-03 LAB — CBC
HCT: 41.5 % (ref 39.0–52.0)
Hemoglobin: 14.4 g/dL (ref 13.0–17.0)
MCH: 28.8 pg (ref 26.0–34.0)
MCHC: 34.7 g/dL (ref 30.0–36.0)
MCV: 83 fL (ref 80.0–100.0)
Platelets: 160 10*3/uL (ref 150–400)
RBC: 5 MIL/uL (ref 4.22–5.81)
RDW: 12.9 % (ref 11.5–15.5)
WBC: 8.7 10*3/uL (ref 4.0–10.5)
nRBC: 0 % (ref 0.0–0.2)

## 2019-09-03 LAB — TROPONIN I (HIGH SENSITIVITY)
Troponin I (High Sensitivity): 2 ng/L (ref <18)
Troponin I (High Sensitivity): 3 ng/L (ref <18)

## 2019-09-03 MED ORDER — SODIUM CHLORIDE 0.9% FLUSH: 3.0000 mL | Freq: Once | INTRAVENOUS | Status: DC

## 2019-09-03 NOTE — ED Triage Notes (Signed)
Pt here for evaluation of sudden onset shob while driving today. Pt sts he felt palpitations at that time and it has happened intermittently a couple more times since then. But has resolved in triage. Sts shob has been constant since onset. Pt not tachypnic, in NAD.

## 2019-09-04 NOTE — ED Notes (Signed)
No answer for vitals recheck x1 

## 2019-12-03 ENCOUNTER — Encounter (HOSPITAL_BASED_OUTPATIENT_CLINIC_OR_DEPARTMENT_OTHER): Payer: Self-pay | Admitting: *Deleted

## 2019-12-03 ENCOUNTER — Other Ambulatory Visit: Payer: Self-pay

## 2019-12-03 ENCOUNTER — Emergency Department (HOSPITAL_BASED_OUTPATIENT_CLINIC_OR_DEPARTMENT_OTHER)
Admission: EM | Admit: 2019-12-03 | Discharge: 2019-12-03 | Disposition: A | Discharge status: Home / Self Care | Payer: Self-pay | Attending: Emergency Medicine | Admitting: Emergency Medicine

## 2019-12-03 DIAGNOSIS — L739 Follicular disorder, unspecified: Secondary | ICD-10-CM | POA: Insufficient documentation

## 2019-12-03 DIAGNOSIS — J45909 Unspecified asthma, uncomplicated: Secondary | ICD-10-CM | POA: Insufficient documentation

## 2019-12-03 DIAGNOSIS — L0291 Cutaneous abscess, unspecified: Secondary | ICD-10-CM

## 2019-12-03 DIAGNOSIS — L02811 Cutaneous abscess of head [any part, except face]: Secondary | ICD-10-CM | POA: Insufficient documentation

## 2019-12-03 DIAGNOSIS — Z87891 Personal history of nicotine dependence: Secondary | ICD-10-CM | POA: Insufficient documentation

## 2019-12-03 DIAGNOSIS — F1210 Cannabis abuse, uncomplicated: Secondary | ICD-10-CM | POA: Insufficient documentation

## 2019-12-03 MED ORDER — LIDOCAINE HCL 2 % IJ SOLN
10.0000 mL | Freq: Once | INTRAMUSCULAR | Status: AC
  Administered 2019-12-03: 200 mg
  Filled 2019-12-03: qty 20

## 2019-12-03 MED ORDER — DOXYCYCLINE HYCLATE 100 MG PO TABS: 100.0000 mg | ORAL_TABLET | Freq: Two times a day (BID) | ORAL | 0 refills | Status: DC

## 2019-12-03 NOTE — ED Provider Notes (Signed)
[facility] EMERGENCY DEPARTMENT Provider Note   CSN: [id number] Arrival date & time: 12/03/19  1504     History Chief Complaint  Patient presents with  . Abscess    Curtis Paul is a 23 y.o. male.  The history is provided by the patient. No language interpreter was used.  Abscess Location:  Head/neck Head/neck abscess location:  Scalp Size:  2 Abscess quality: not draining   Red streaking: no   Progression:  Worsening Chronicity:  New Relieved by:  Nothing Worsened by:  Nothing Ineffective treatments:  None tried Pt complains of an abscess to the back of his head      Past Medical History:  Diagnosis Date  . Asthma     There are no problems to display for this patient.   History reviewed. No pertinent surgical history.     No family history on file.  Social History   Tobacco Use  . Smoking status: Former Research scientist (life sciences)  . Smokeless tobacco: Never Used  . Tobacco comment: Black and Mouse  Substance Use Topics  . Alcohol use: No  . Drug use: Yes    Types: Marijuana    Comment: last smoked 2 days ago    Home Medications Prior to Admission medications   Medication Sig Start Date End Date Taking? Authorizing Provider  albuterol (PROVENTIL HFA;VENTOLIN HFA) 108 (90 Base) MCG/ACT inhaler Inhale 1-2 puffs into the lungs every 6 (six) hours as needed for wheezing or shortness of breath. 12/18/18   Petrucelli, Samantha R, PA-C  doxycycline (VIBRA-TABS) 100 MG tablet Take 1 tablet (100 mg total) by mouth 2 (two) times daily. 12/03/19   Fransico Meadow, PA-C    Allergies    Shellfish allergy  Review of Systems   Review of Systems  Skin: Positive for wound.  All other systems reviewed and are negative.   Physical Exam Updated Vital Signs BP (!) 150/84 (BP Location: Right Arm)   Pulse 73   Temp 98.3 F (36.8 C) (Oral)   Resp 14   Ht 5\' 10"  (1.778 m)   Wt 79.4 kg   SpO2 100%   BMI 25.11 kg/m   Physical Exam Vitals and nursing note  reviewed.  Constitutional:      Appearance: He is well-developed.  HENT:     Head: Normocephalic and atraumatic.     Nose: Nose normal.  Eyes:     Conjunctiva/sclera: Conjunctivae normal.  Cardiovascular:     Rate and Rhythm: Normal rate and regular rhythm.     Heart sounds: No murmur.  Pulmonary:     Effort: Pulmonary effort is normal. No respiratory distress.     Breath sounds: Normal breath sounds.  Abdominal:     Tenderness: There is no abdominal tenderness.  Musculoskeletal:     Cervical back: Neck supple.  Skin:    General: Skin is warm and dry.     Comments: 2cm abscess occipital scalp, area of hair loss.   Neurological:     Mental Status: He is alert.  Psychiatric:        Mood and Affect: Mood normal.     ED Results / Procedures / Treatments   Labs (all labs ordered are listed, but only abnormal results are displayed) Labs Reviewed - No data to display  EKG None  Radiology No results found.  Procedures .Marland KitchenIncision and Drainage  Date/Time: 12/03/2019 6:34 PM Performed by: Fransico Meadow, PA-C Authorized by: Fransico Meadow, PA-C   Consent:  Consent obtained:  Verbal   Consent given by:  Patient   Risks discussed:  Incomplete drainage   Alternatives discussed:  Delayed treatment Location:    Type:  Abscess   Location:  Head   Head location:  Scalp Pre-procedure details:    Skin preparation:  Betadine Anesthesia (see MAR for exact dosages):    Anesthesia method:  Local infiltration   Local anesthetic:  Lidocaine 2% w/o epi Procedure type:    Complexity:  Simple Procedure details:    Needle aspiration: no     Incision types:  Single straight   Incision depth:  Subcutaneous   Scalpel blade:  11   Wound management:  Probed and deloculated   Drainage amount:  Moderate   Wound treatment:  Wound left open Post-procedure details:    Patient tolerance of procedure:  Tolerated well, no immediate complications   (including critical care  time)  Medications Ordered in ED Medications  lidocaine (XYLOCAINE) 2 % (with pres) injection 200 mg (has no administration in time range)    ED Course  I have reviewed the triage vital signs and the nursing notes.  Pertinent labs & imaging results that were available during my care of the patient were reviewed by me and considered in my medical decision making (see chart for details).    MDM Rules/Calculators/A&P                      MDM   Pt counseled on abscess, Pt reports he plans to see a dermatologist due to abscesses and hair loss  Final Clinical Impression(s) / ED Diagnoses Final diagnoses:  Abscess  Folliculitis    Rx / DC Orders ED Discharge Orders         Ordered    doxycycline (VIBRA-TABS) 100 MG tablet  2 times daily     12/03/19 1725        An After Visit Summary was printed and given to the patient.    Elson Areas, PA-C 12/03/19 1836    Rolan Bucco, MD 12/03/19 [phone]

## 2019-12-03 NOTE — Discharge Instructions (Signed)
Return if any problems.  Schedule to see a dermatologist for evaluation

## 2019-12-03 NOTE — ED Triage Notes (Signed)
C/o abscess to posterior head x 2 weeks

## 2019-12-10 ENCOUNTER — Other Ambulatory Visit: Payer: Self-pay

## 2019-12-10 ENCOUNTER — Encounter (HOSPITAL_BASED_OUTPATIENT_CLINIC_OR_DEPARTMENT_OTHER): Payer: Self-pay | Admitting: *Deleted

## 2019-12-10 ENCOUNTER — Emergency Department (HOSPITAL_BASED_OUTPATIENT_CLINIC_OR_DEPARTMENT_OTHER): Admission: EM | Admit: 2019-12-10 | Discharge: 2019-12-10 | Discharge status: Against Medical Advice | Payer: Self-pay

## 2019-12-10 NOTE — ED Triage Notes (Signed)
C/cough fever , congestion x 2 days

## 2019-12-11 ENCOUNTER — Emergency Department (HOSPITAL_BASED_OUTPATIENT_CLINIC_OR_DEPARTMENT_OTHER)
Admission: EM | Admit: 2019-12-11 | Discharge: 2019-12-12 | Disposition: A | Discharge status: Home / Self Care | Payer: Self-pay | Attending: Emergency Medicine | Admitting: Emergency Medicine

## 2019-12-11 ENCOUNTER — Other Ambulatory Visit: Payer: Self-pay

## 2019-12-11 ENCOUNTER — Encounter (HOSPITAL_BASED_OUTPATIENT_CLINIC_OR_DEPARTMENT_OTHER): Payer: Self-pay

## 2019-12-11 DIAGNOSIS — F17290 Nicotine dependence, other tobacco product, uncomplicated: Secondary | ICD-10-CM | POA: Insufficient documentation

## 2019-12-11 DIAGNOSIS — J45909 Unspecified asthma, uncomplicated: Secondary | ICD-10-CM | POA: Insufficient documentation

## 2019-12-11 DIAGNOSIS — R0602 Shortness of breath: Secondary | ICD-10-CM

## 2019-12-11 DIAGNOSIS — Z20822 Contact with and (suspected) exposure to covid-19: Secondary | ICD-10-CM | POA: Insufficient documentation

## 2019-12-11 MED ORDER — DEXAMETHASONE 10 MG/ML FOR PEDIATRIC ORAL USE
10.0000 mg | Freq: Once | INTRAMUSCULAR | Status: AC
  Administered 2019-12-12: 10 mg via ORAL
  Filled 2019-12-11: qty 1

## 2019-12-11 MED ORDER — ALBUTEROL SULFATE HFA 108 (90 BASE) MCG/ACT IN AERS
2.0000 | INHALATION_SPRAY | RESPIRATORY_TRACT | Status: DC | PRN
  Administered 2019-12-12: 2 via RESPIRATORY_TRACT
  Filled 2019-12-11: qty 6.7

## 2019-12-11 NOTE — ED Triage Notes (Signed)
SOB x 3 days, hx of asthma as a kid, states feeling of something in his throat & congestion

## 2019-12-11 NOTE — ED Provider Notes (Signed)
[facility] Provider Note: Curtis Dell, MD, FACEP  CSN: [id number] MRN: [id number] ARRIVAL: 12/11/19 at 2340 ROOM: MH02/MH02   CHIEF COMPLAINT  Shortness of Breath   HISTORY OF PRESENT ILLNESS  12/11/19 11:52 PM Curtis Paul is a 24 y.o. male with a remote history of asthma.  He is here with a 3-day history of shortness of breath, a sensation of something in his throat and intermittent nasal congestion.  He has been using his cousin's inhaler with partial relief of his dyspnea.  His symptoms are mild presently but have been worse at times.  He is not aware of having a fever.  He denies loss of taste or smell.  He denies nausea, vomiting or diarrhea.  He was tested for Covid yesterday and results are pending.  He rates his throat discomfort as a 6 out of 10.   Past Medical History:  Diagnosis Date  . Asthma     History reviewed. No pertinent surgical history.  History reviewed. No pertinent family history.  Social History   Tobacco Use  . Smoking status: Former Games developer  . Smokeless tobacco: Never Used  . Tobacco comment: Black and Mouse  Substance Use Topics  . Alcohol use: No  . Drug use: Yes    Types: Marijuana    Comment: last smoked 2 days ago    Prior to Admission medications   Medication Sig Start Date End Date Taking? Authorizing Provider  albuterol (PROVENTIL HFA;VENTOLIN HFA) 108 (90 Base) MCG/ACT inhaler Inhale 1-2 puffs into the lungs every 6 (six) hours as needed for wheezing or shortness of breath. 12/18/18 12/12/19  Petrucelli, Samantha R, PA-C    Allergies Shellfish allergy   REVIEW OF SYSTEMS  Negative except as noted here or in the History of Present Illness.   PHYSICAL EXAMINATION  Initial Vital Signs Blood pressure (!) 154/93, pulse 82, temperature 98 F (36.7 C), temperature source Oral, resp. rate 17, height 5\' 10"  (1.778 m), weight 77.1 kg, SpO2 100 %.  Examination General: Well-developed, well-nourished male in no acute  distress; appearance consistent with age of record HENT: normocephalic; atraumatic; mild pharyngeal erythema without exudate Eyes: pupils equal, round and reactive to light; extraocular muscles intact Neck: supple Heart: regular rate and rhythm Lungs: clear to auscultation bilaterally Abdomen: soft; nondistended; nontender; bowel sounds present Extremities: No deformity; full range of motion Neurologic: Awake, alert and oriented; motor function intact in all extremities and symmetric; no facial droop Skin: Warm and dry Psychiatric: Normal mood and affect   RESULTS  Summary of this visit's results, reviewed and interpreted by myself:   EKG Interpretation  Date/Time:    Ventricular Rate:    PR Interval:    QRS Duration:   QT Interval:    QTC Calculation:   R Axis:     Text Interpretation:        Laboratory Studies: No results found for this or any previous visit (from the past 24 hour(s)). Imaging Studies: No results found.  ED COURSE and MDM  Nursing notes, initial and subsequent vitals signs, including pulse oximetry, reviewed and interpreted by myself.  Vitals:   12/11/19 2347 12/11/19 2348  BP: (!) 154/93   Pulse: 82   Resp: 17   Temp: 98 F (36.7 C)   TempSrc: Oral   SpO2: 100%   Weight:  77.1 kg  Height:  5\' 10"  (1.778 m)   Medications  albuterol (VENTOLIN HFA) 108 (90 Base) MCG/ACT inhaler 2 puff (has no administration  in time range)  dexamethasone (DECADRON) 10 MG/ML injection for Pediatric ORAL use 10 mg (has no administration in time range)    Curtis Paul was evaluated in Emergency Department on 12/12/2019 for the symptoms described in the history of present illness. He was evaluated in the context of the global COVID-19 pandemic, which necessitated consideration that the patient might be at risk for infection with the SARS-CoV-2 virus that causes COVID-19. Institutional protocols and algorithms that pertain to the evaluation of patients at risk for  COVID-19 are in a state of rapid change based on information released by regulatory bodies including the CDC and federal and state organizations. These policies and algorithms were followed during the patient's care in the ED.  We will provide the patient a new albuterol inhaler and AeroChamber.  We will also give him a dose of dexamethasone.  This could represent a mild case of COVID-19.  PROCEDURES  Procedures   ED DIAGNOSES     ICD-10-CM   1. Suspected COVID-19 virus infection  Z20.822   2. Shortness of breath  R06.02        Jencarlo Bonadonna, Jenny Reichmann, MD 12/12/19 0000

## 2019-12-12 LAB — NOVEL CORONAVIRUS, NAA (HOSP ORDER, SEND-OUT TO REF LAB; TAT 18-24 HRS): SARS-CoV-2, NAA: NOT DETECTED

## 2020-01-12 ENCOUNTER — Emergency Department (HOSPITAL_BASED_OUTPATIENT_CLINIC_OR_DEPARTMENT_OTHER)
Admission: EM | Admit: 2020-01-12 | Discharge: 2020-01-12 | Disposition: A | Discharge status: Home / Self Care | Payer: Self-pay | Attending: Emergency Medicine | Admitting: Emergency Medicine

## 2020-01-12 ENCOUNTER — Other Ambulatory Visit: Payer: Self-pay

## 2020-01-12 ENCOUNTER — Encounter (HOSPITAL_BASED_OUTPATIENT_CLINIC_OR_DEPARTMENT_OTHER): Payer: Self-pay | Admitting: *Deleted

## 2020-01-12 ENCOUNTER — Emergency Department (HOSPITAL_BASED_OUTPATIENT_CLINIC_OR_DEPARTMENT_OTHER): Payer: Self-pay

## 2020-01-12 DIAGNOSIS — J45909 Unspecified asthma, uncomplicated: Secondary | ICD-10-CM | POA: Insufficient documentation

## 2020-01-12 DIAGNOSIS — F17200 Nicotine dependence, unspecified, uncomplicated: Secondary | ICD-10-CM | POA: Insufficient documentation

## 2020-01-12 DIAGNOSIS — F410 Panic disorder [episodic paroxysmal anxiety] without agoraphobia: Secondary | ICD-10-CM | POA: Insufficient documentation

## 2020-01-12 MED ORDER — ALBUTEROL SULFATE HFA 108 (90 BASE) MCG/ACT IN AERS
4.0000 | INHALATION_SPRAY | Freq: Once | RESPIRATORY_TRACT | Status: DC
  Filled 2020-01-12: qty 6.7

## 2020-01-12 MED ORDER — ALBUTEROL SULFATE HFA 108 (90 BASE) MCG/ACT IN AERS
4.0000 | INHALATION_SPRAY | Freq: Four times a day (QID) | RESPIRATORY_TRACT | Status: DC | PRN
  Administered 2020-01-12: 4 via RESPIRATORY_TRACT

## 2020-01-12 NOTE — ED Triage Notes (Signed)
Asthma. States he ran out of his inhaler 2 days ago and needs a refill.

## 2020-01-12 NOTE — ED Provider Notes (Addendum)
TIME SEEN: 11:00 PM  CHIEF COMPLAINT: "Breathing problems"  HPI: Patient is a 24 year old male with history of asthma, tobacco use who presents to the emergency department with "breathing problems" that started 15 minutes ago. States he felt like he could not catch his breath and his heart was racing. He states he thinks he may be having panic attacks.  He states symptoms started immediately after smoking marijuana.  This is happened several times before with smoking marijuana.  Symptoms have since resolved. No fevers, cough, lower extremity swelling or pain, vomiting or diarrhea. Does state he thinks his tonsils may be swollen. Has not noticed any wheezing but thinks an inhaler may make him feel better. He is out of his inhaler at home. Denies chest pain or chest discomfort.  ROS: See HPI Constitutional: no fever  Eyes: no drainage  ENT: no runny nose   Cardiovascular:  no chest pain  Resp:  SOB  GI: no vomiting GU: no dysuria Integumentary: no rash  Allergy: no hives  Musculoskeletal: no leg swelling  Neurological: no slurred speech ROS otherwise negative  PAST MEDICAL HISTORY/PAST SURGICAL HISTORY:  Past Medical History:  Diagnosis Date  . Asthma     MEDICATIONS:  Prior to Admission medications   Medication Sig Start Date End Date Taking? Authorizing Provider  albuterol (PROVENTIL HFA;VENTOLIN HFA) 108 (90 Base) MCG/ACT inhaler Inhale 1-2 puffs into the lungs every 6 (six) hours as needed for wheezing or shortness of breath. 12/18/18 12/12/19  Petrucelli, Glynda Jaeger, PA-C    ALLERGIES:  Allergies  Allergen Reactions  . Shellfish Allergy     SOCIAL HISTORY:  Social History   Tobacco Use  . Smoking status: Current Every Day Smoker  . Smokeless tobacco: Never Used  . Tobacco comment: Black and Mouse  Substance Use Topics  . Alcohol use: No    FAMILY HISTORY: No family history on file.  EXAM: BP (!) 146/87   Pulse 95   Temp 98 F (36.7 C) (Oral)   Resp 18   Ht 5'  10" (1.778 m)   Wt 79.4 kg   SpO2 98%   BMI 25.11 kg/m  CONSTITUTIONAL: Alert and oriented and responds appropriately to questions. Well-appearing; well-nourished HEAD: Normocephalic EYES: Conjunctivae clear, pupils appear equal, EOM appear intact ENT: normal nose; moist mucous membranes; No pharyngeal erythema or petechiae, no tonsillar hypertrophy or exudate, no uvular deviation, no unilateral swelling, no trismus or drooling, no muffled voice, normal phonation, no stridor, no dental caries present, no drainable dental abscess noted, no Ludwig's angina, tongue sits flat in the bottom of the mouth, no angioedema, no facial erythema or warmth, no facial swelling; no pain with movement of the neck, no cervical LAD. NECK: Supple, normal ROM CARD: RRR; S1 and S2 appreciated; no murmurs, no clicks, no rubs, no gallops RESP: Normal chest excursion without splinting or tachypnea; breath sounds clear and equal bilaterally; no wheezes, no rhonchi, no rales, no hypoxia or respiratory distress, speaking full sentences ABD/GI: Normal bowel sounds; non-distended; soft, non-tender, no rebound, no guarding, no peritoneal signs, no hepatosplenomegaly BACK:  The back appears normal EXT: Normal ROM in all joints; no deformity noted, no edema; no cyanosis SKIN: Normal color for age and race; warm; no rash on exposed skin NEURO: Moves all extremities equally PSYCH: The patient's mood and manner are appropriate.   MEDICAL DECISION MAKING: Patient here with nonspecific symptoms of palpitations and shortness of breath that have resolved. He thinks he may be having panic attacks. He  also thinks his asthma may be contributing but currently his lungs are clear with good aeration. Will provide with new albuterol inhaler. Will obtain EKG, chest x-ray but low suspicion for ACS, PE, dissection, dehydration, anemia, electrolyte derangement especially given he is asymptomatic currently.  ED PROGRESS: EKG and chest x-ray are  unremarkable. I feel he is safe to be discharged home.  Patient continues to be asymptomatic and hemodynamically stable.  Will give outpatient PCP follow-up information.  I recommended that he stop smoking marijuana.   At this time, I do not feel there is any life-threatening condition present. I have reviewed, interpreted and discussed all results (EKG, imaging, lab, urine as appropriate) and exam findings with patient/family. I have reviewed nursing notes and appropriate previous records.  I feel the patient is safe to be discharged home without further emergent workup and can continue workup as an outpatient as needed. Discussed usual and customary return precautions. Patient/family verbalize understanding and are comfortable with this plan.  Outpatient follow-up has been provided as needed. All questions have been answered.     EKG Interpretation  Date/Time:  Monday January 12 2020 23:11:57 EST Ventricular Rate:  91 PR Interval:    QRS Duration: 90 QT Interval:  346 QTC Calculation: 426 R Axis:   75 Text Interpretation: Sinus rhythm Probable left ventricular hypertrophy No significant change since last tracing Confirmed by Rochele Raring [phone]) on 01/12/2020 11:14:49 PM          Monika Salk was evaluated in Emergency Department on 01/12/2020 for the symptoms described in the history of present illness. He was evaluated in the context of the global COVID-19 pandemic, which necessitated consideration that the patient might be at risk for infection with the SARS-CoV-2 virus that causes COVID-19. Institutional protocols and algorithms that pertain to the evaluation of patients at risk for COVID-19 are in a state of rapid change based on information released by regulatory bodies including the CDC and federal and state organizations. These policies and algorithms were followed during the patient's care in the ED.  Patient was seen wearing N95, face shield, gloves.    Reeves Musick, Layla Maw,  DO 01/12/20 2318    Harley Mccartney, Layla Maw, DO 01/13/20 0003

## 2020-01-18 ENCOUNTER — Encounter (HOSPITAL_BASED_OUTPATIENT_CLINIC_OR_DEPARTMENT_OTHER): Payer: Self-pay | Admitting: *Deleted

## 2020-01-18 ENCOUNTER — Emergency Department (HOSPITAL_BASED_OUTPATIENT_CLINIC_OR_DEPARTMENT_OTHER)
Admission: EM | Admit: 2020-01-18 | Discharge: 2020-01-19 | Disposition: A | Discharge status: Home / Self Care | Payer: Self-pay | Attending: Emergency Medicine | Admitting: Emergency Medicine

## 2020-01-18 ENCOUNTER — Other Ambulatory Visit: Payer: Self-pay

## 2020-01-18 DIAGNOSIS — J45909 Unspecified asthma, uncomplicated: Secondary | ICD-10-CM | POA: Insufficient documentation

## 2020-01-18 DIAGNOSIS — F17200 Nicotine dependence, unspecified, uncomplicated: Secondary | ICD-10-CM | POA: Insufficient documentation

## 2020-01-18 DIAGNOSIS — Z79899 Other long term (current) drug therapy: Secondary | ICD-10-CM | POA: Insufficient documentation

## 2020-01-18 DIAGNOSIS — T7840XA Allergy, unspecified, initial encounter: Secondary | ICD-10-CM | POA: Insufficient documentation

## 2020-01-18 DIAGNOSIS — Z91013 Allergy to seafood: Secondary | ICD-10-CM | POA: Insufficient documentation

## 2020-01-18 DIAGNOSIS — Z91018 Allergy to other foods: Secondary | ICD-10-CM

## 2020-01-18 MED ORDER — DIPHENHYDRAMINE HCL 25 MG PO CAPS
25.0000 mg | ORAL_CAPSULE | Freq: Once | ORAL | Status: AC
  Administered 2020-01-18: 25 mg via ORAL
  Filled 2020-01-18: qty 1

## 2020-01-18 MED ORDER — FAMOTIDINE 20 MG PO TABS
20.0000 mg | ORAL_TABLET | Freq: Once | ORAL | Status: AC
  Administered 2020-01-18: 20 mg via ORAL
  Filled 2020-01-18: qty 1

## 2020-01-18 MED ORDER — PREDNISONE 50 MG PO TABS
60.0000 mg | ORAL_TABLET | Freq: Once | ORAL | Status: AC
  Administered 2020-01-18: 60 mg via ORAL
  Filled 2020-01-18: qty 1

## 2020-01-18 NOTE — ED Provider Notes (Signed)
[facility] EMERGENCY DEPARTMENT Provider Note   CSN: [id number] Arrival date & time: 01/18/20  2322     History Chief Complaint  Patient presents with  . allergic reaction    Curtis Paul is a 24 y.o. male.  HPI    This is a 24 year old male with a history of asthma who presents with possible allergic reaction.  Patient reports that he ate dinner at [facility] around 10 PM.  He has a known shellfish allergy.  He did not intentionally ingest shellfish but after eating noted itching in his throat.  Denies throat swelling or difficulty swallowing.  Denies shortness of breath, nausea, vomiting, hives.  He does not know what his shellfish allergy is.  He did not take anything for his symptoms prior to arrival. Past Medical History:  Diagnosis Date  . Asthma     There are no problems to display for this patient.   History reviewed. No pertinent surgical history.     No family history on file.  Social History   Tobacco Use  . Smoking status: Current Every Day Smoker  . Smokeless tobacco: Never Used  . Tobacco comment: Black and Mouse  Substance Use Topics  . Alcohol use: No  . Drug use: Yes    Types: Marijuana    Comment: last smoked 2 days ago    Home Medications Prior to Admission medications   Medication Sig Start Date End Date Taking? Authorizing Provider  diphenhydrAMINE (BENADRYL) 25 MG tablet Take 1 tablet (25 mg total) by mouth every 6 (six) hours as needed. 01/19/20   Charlie Char, Mayer Masker, MD  EPINEPHrine 0.3 mg/0.3 mL IJ SOAJ injection Inject 0.3 mLs (0.3 mg total) into the muscle as needed for anaphylaxis. 01/19/20   Wright Gravely, Mayer Masker, MD  famotidine (PEPCID) 20 MG tablet Take 1 tablet (20 mg total) by mouth daily. 01/19/20   Nathanie Ottley, Mayer Masker, MD  predniSONE (DELTASONE) 20 MG tablet Take 2 tablets (40 mg total) by mouth daily. 01/19/20   Alfie Rideaux, Mayer Masker, MD  albuterol (PROVENTIL HFA;VENTOLIN HFA) 108 (90 Base) MCG/ACT inhaler Inhale 1-2 puffs into  the lungs every 6 (six) hours as needed for wheezing or shortness of breath. 12/18/18 12/12/19  Petrucelli, Samantha R, PA-C    Allergies    Shellfish allergy  Review of Systems   Review of Systems  HENT: Negative for sneezing and trouble swallowing.        Itchy throat  Respiratory: Negative for cough and shortness of breath.   Cardiovascular: Negative for chest pain.  Gastrointestinal: Negative for abdominal pain, diarrhea and vomiting.  Genitourinary: Negative for dysuria.  Skin: Negative for rash.  All other systems reviewed and are negative.   Physical Exam Updated Vital Signs BP (!) 163/88 (BP Location: Right Arm)   Pulse 80   Temp 98.4 F (36.9 C) (Oral)   Resp 20   Ht 1.778 m (5\' 10" )   Wt 84.4 kg   SpO2 100%   BMI 26.69 kg/m   Physical Exam Vitals and nursing note reviewed.  Constitutional:      Appearance: He is well-developed. He is not ill-appearing.     Comments: ABCs intact  HENT:     Head: Normocephalic and atraumatic.     Mouth/Throat:     Mouth: Mucous membranes are moist.     Comments: No swelling noted to the posterior oropharynx Eyes:     Pupils: Pupils are equal, round, and reactive to light.  Cardiovascular:  Rate and Rhythm: Normal rate and regular rhythm.     Heart sounds: Normal heart sounds. No murmur.  Pulmonary:     Effort: Pulmonary effort is normal. No respiratory distress.     Breath sounds: Normal breath sounds. No wheezing.  Abdominal:     General: Bowel sounds are normal.     Palpations: Abdomen is soft.     Tenderness: There is no abdominal tenderness. There is no rebound.  Musculoskeletal:     Cervical back: Neck supple.  Lymphadenopathy:     Cervical: No cervical adenopathy.  Skin:    General: Skin is warm and dry.  Neurological:     Mental Status: He is alert and oriented to person, place, and time.  Psychiatric:        Mood and Affect: Mood normal.     ED Results / Procedures / Treatments   Labs (all labs  ordered are listed, but only abnormal results are displayed) Labs Reviewed - No data to display  EKG None  Radiology No results found.  Procedures Procedures (including critical care time)  Medications Ordered in ED Medications  LORazepam (ATIVAN) 2 MG/ML injection (has no administration in time range)  predniSONE (DELTASONE) tablet 60 mg (60 mg Oral Given 01/18/20 2348)  famotidine (PEPCID) tablet 20 mg (20 mg Oral Given 01/18/20 2348)  diphenhydrAMINE (BENADRYL) capsule 25 mg (25 mg Oral Given 01/18/20 2348)    ED Course  I have reviewed the triage vital signs and the nursing notes.  Pertinent labs & imaging results that were available during my care of the patient were reviewed by me and considered in my medical decision making (see chart for details).  Clinical Course as of Jan 18 41  Mon Jan 19, 2020  0039 On recheck, patient reports improvement of symptoms.  Clinically stable and ABCs are intact.   [CH]    Clinical Course User Index [CH] Janayah Zavada, Barbette Hair, MD   MDM Rules/Calculators/A&P                       Patient presents with itchy throat.  Concerned that may he may have had an exposure to shellfish at dinner.  He is overall nontoxic-appearing and ABCs intact.  No objective signs of anaphylaxis.  Patient was given prednisone, Pepcid, and Benadryl.  No indication for epinephrine at this time.  On recheck he clinically reports improvement and remains stable.  Will send home with short course of prednisone and Pepcid.  He was also provided with an EpiPen in the event that he has future reaction or anaphylaxis.  After history, exam, and medical workup I feel the patient has been appropriately medically screened and is safe for discharge home. Pertinent diagnoses were discussed with the patient. Patient was given return precautions.   Final Clinical Impression(s) / ED Diagnoses Final diagnoses:  Food allergy    Rx / DC Orders ED Discharge Orders         Ordered      predniSONE (DELTASONE) 20 MG tablet  Daily     01/19/20 0041    famotidine (PEPCID) 20 MG tablet  Daily     01/19/20 0041    diphenhydrAMINE (BENADRYL) 25 MG tablet  Every 6 hours PRN     01/19/20 0041    EPINEPHrine 0.3 mg/0.3 mL IJ SOAJ injection  As needed     01/19/20 0041           Dravyn Severs, Barbette Hair, MD 01/19/20 [phone]

## 2020-01-18 NOTE — ED Triage Notes (Signed)
Pt states that he ate at [employer] around 2200. Concerned he ate something with shrimp in it because his throat feels like its "itching" denies any sob or hives. Has not taken any medications.

## 2020-01-19 MED ORDER — PREDNISONE 20 MG PO TABS: 40.0000 mg | ORAL_TABLET | Freq: Every day | ORAL | 0 refills | Status: DC

## 2020-01-19 MED ORDER — DIPHENHYDRAMINE HCL 25 MG PO TABS: 25.0000 mg | ORAL_TABLET | Freq: Four times a day (QID) | ORAL | 0 refills | Status: AC | PRN

## 2020-01-19 MED ORDER — FAMOTIDINE 20 MG PO TABS: 20.0000 mg | ORAL_TABLET | Freq: Every day | ORAL | 0 refills | Status: AC

## 2020-01-19 MED ORDER — EPINEPHRINE 0.3 MG/0.3ML IJ SOAJ: 0.3000 mg | INTRAMUSCULAR | 0 refills | Status: AC | PRN

## 2020-01-19 MED ORDER — LORAZEPAM 2 MG/ML IJ SOLN
INTRAMUSCULAR | Status: AC
  Filled 2020-01-19: qty 1

## 2020-01-19 NOTE — Discharge Instructions (Addendum)
You were seen today for likely a mild allergic reaction to shellfish.  Take prednisone and Pepcid daily for the next 4 to 5 days.  Use Benadryl for any itching.  You will also be given a prescription for an EpiPen if you ever have an anaphylactic reaction in the future.

## 2020-03-26 ENCOUNTER — Emergency Department (HOSPITAL_BASED_OUTPATIENT_CLINIC_OR_DEPARTMENT_OTHER)
Admission: EM | Admit: 2020-03-26 | Discharge: 2020-03-26 | Disposition: A | Discharge status: Home / Self Care | Payer: Self-pay | Attending: Emergency Medicine | Admitting: Emergency Medicine

## 2020-03-26 ENCOUNTER — Emergency Department (HOSPITAL_BASED_OUTPATIENT_CLINIC_OR_DEPARTMENT_OTHER): Payer: Self-pay

## 2020-03-26 ENCOUNTER — Other Ambulatory Visit: Payer: Self-pay

## 2020-03-26 ENCOUNTER — Encounter (HOSPITAL_BASED_OUTPATIENT_CLINIC_OR_DEPARTMENT_OTHER): Payer: Self-pay | Admitting: *Deleted

## 2020-03-26 DIAGNOSIS — Z91013 Allergy to seafood: Secondary | ICD-10-CM | POA: Insufficient documentation

## 2020-03-26 DIAGNOSIS — R1012 Left upper quadrant pain: Secondary | ICD-10-CM | POA: Insufficient documentation

## 2020-03-26 DIAGNOSIS — Z79899 Other long term (current) drug therapy: Secondary | ICD-10-CM | POA: Insufficient documentation

## 2020-03-26 DIAGNOSIS — Z87891 Personal history of nicotine dependence: Secondary | ICD-10-CM | POA: Insufficient documentation

## 2020-03-26 DIAGNOSIS — J45909 Unspecified asthma, uncomplicated: Secondary | ICD-10-CM | POA: Insufficient documentation

## 2020-03-26 LAB — COMPREHENSIVE METABOLIC PANEL
ALT: 9 U/L (ref 0–44)
AST: 18 U/L (ref 15–41)
Albumin: 4.3 g/dL (ref 3.5–5.0)
Alkaline Phosphatase: 47 U/L (ref 38–126)
Anion gap: 8 (ref 5–15)
BUN: 15 mg/dL (ref 6–20)
CO2: 25 mmol/L (ref 22–32)
Calcium: 9.2 mg/dL (ref 8.9–10.3)
Chloride: 104 mmol/L (ref 98–111)
Creatinine, Ser: 0.83 mg/dL (ref 0.61–1.24)
GFR calc Af Amer: >60 mL/min (ref >60)
GFR calc non Af Amer: >60 mL/min (ref >60)
Glucose, Bld: 97 mg/dL (ref 70–99)
Potassium: 4 mmol/L (ref 3.5–5.1)
Sodium: 137 mmol/L (ref 135–145)
Total Bilirubin: 0.6 mg/dL (ref 0.3–1.2)
Total Protein: 7.6 g/dL (ref 6.5–8.1)

## 2020-03-26 LAB — CBC WITH DIFFERENTIAL/PLATELET
Abs Immature Granulocytes: 0.01 10*3/uL (ref 0.00–0.07)
Basophils Absolute: 0 10*3/uL (ref 0.0–0.1)
Basophils Relative: 0 %
Eosinophils Absolute: 0.2 10*3/uL (ref 0.0–0.5)
Eosinophils Relative: 3 %
HCT: 44.2 % (ref 39.0–52.0)
Hemoglobin: 14.9 g/dL (ref 13.0–17.0)
Immature Granulocytes: 0 %
Lymphocytes Relative: 30 %
Lymphs Abs: 2.1 10*3/uL (ref 0.7–4.0)
MCH: 27.5 pg (ref 26.0–34.0)
MCHC: 33.7 g/dL (ref 30.0–36.0)
MCV: 81.5 fL (ref 80.0–100.0)
Monocytes Absolute: 0.6 10*3/uL (ref 0.1–1.0)
Monocytes Relative: 8 %
Neutro Abs: 4.1 10*3/uL (ref 1.7–7.7)
Neutrophils Relative %: 59 %
Platelets: 159 10*3/uL (ref 150–400)
RBC: 5.42 MIL/uL (ref 4.22–5.81)
RDW: 12.7 % (ref 11.5–15.5)
WBC: 6.9 10*3/uL (ref 4.0–10.5)
nRBC: 0 % (ref 0.0–0.2)

## 2020-03-26 LAB — URINALYSIS, ROUTINE W REFLEX MICROSCOPIC
Bilirubin Urine: NEGATIVE
Glucose, UA: NEGATIVE mg/dL
Hgb urine dipstick: NEGATIVE
Ketones, ur: NEGATIVE mg/dL
Leukocytes,Ua: NEGATIVE
Nitrite: NEGATIVE
Protein, ur: NEGATIVE mg/dL
Specific Gravity, Urine: 1.02 (ref 1.005–1.030)
pH: 7 (ref 5.0–8.0)

## 2020-03-26 LAB — LIPASE, BLOOD: Lipase: 25 U/L (ref 11–51)

## 2020-03-26 MED ORDER — METHOCARBAMOL 500 MG PO TABS: 500.0000 mg | ORAL_TABLET | Freq: Two times a day (BID) | ORAL | 0 refills | Status: AC

## 2020-03-26 MED ORDER — IBUPROFEN 400 MG PO TABS
600.0000 mg | ORAL_TABLET | Freq: Once | ORAL | Status: AC
  Administered 2020-03-26: 600 mg via ORAL
  Filled 2020-03-26: qty 1

## 2020-03-26 MED ORDER — METHOCARBAMOL 500 MG PO TABS
500.0000 mg | ORAL_TABLET | Freq: Once | ORAL | Status: AC
  Administered 2020-03-26: 500 mg via ORAL
  Filled 2020-03-26: qty 1

## 2020-03-26 MED ORDER — IBUPROFEN 600 MG PO TABS: 600.0000 mg | ORAL_TABLET | Freq: Four times a day (QID) | ORAL | 0 refills | Status: AC | PRN

## 2020-03-26 NOTE — ED Notes (Signed)
ED Provider at bedside. 

## 2020-03-26 NOTE — Discharge Instructions (Signed)
Your work-up today is very reassuring, I suspect this pain may be due to a pulled muscle from lifting heavy furniture.  Take ibuprofen as prescribed, you can take Tylenol with this but do not combine with any other over-the-counter pain relievers, you can also use over-the-counter muscle rubs or lidocaine patches, ice and heat.  You can use prescribed muscle relaxer as needed, but do not use this before driving as it can cause some drowsiness.  Avoid heavy lifting over the next 2 days.  Return to the ED if you develop worsening pain, vomiting, fevers or any other new or concerning symptoms.

## 2020-03-26 NOTE — ED Provider Notes (Signed)
[place] EMERGENCY DEPARTMENT Provider Note   CSN: [id number] Arrival date & time: 03/26/20  1340     History Chief Complaint  Patient presents with  . Abdominal Pain    LUQ    Curtis Paul is a 24 y.o. male.  Curtis Paul is a 24 y.o. male with a history of asthma, otherwise healthy, who presents to the emergency department for evaluation of left upper quadrant abdominal pain.  He first noticed the pain last night.  He reports it felt like a cramping tightness in this area.  He reports it seemed to ease off and he was able to go to sleep but when he got up this morning with certain movements the pain seemed to come back, he states is continued to come and go throughout the day.  Aside from certain movements he has not noticed anything else that makes pain better or worse.  He denies any associated nausea or vomiting.  Pain is present more so in the front of his abdomen but then over his flank or back.  Pain does not radiate into the chest.  No associated diarrhea, constipation or blood in the stools.  No fevers or chills.  No dysuria or urinary frequency.  No hematuria.  Has never had similar pain like this, no history of kidney stones.  He reports that his mom has a history of Crohn's, he has never been tested or diagnosed with Crohn's or had any previous GI problems.  He has not taken anything to treat this pain prior to arrival.  No associated shortness of breath, cough or fever.  Does report that yesterday evening prior to pain starting he was moving a heavy dresser by himself, denies any other strenuous activity.  Wonders if he could to have strained something.  No other aggravating or alleviating factors.        Past Medical History:  Diagnosis Date  . Asthma     There are no problems to display for this patient.   History reviewed. No pertinent surgical history.     History reviewed. No pertinent family history.  Social History   Tobacco Use  .  Smoking status: Former Smoker    Types: Cigars  . Smokeless tobacco: Never Used  . Tobacco comment: Black and Mouse  Substance Use Topics  . Alcohol use: No  . Drug use: Yes    Types: Marijuana    Comment: last smoked 2 days ago    Home Medications Prior to Admission medications   Medication Sig Start Date End Date Taking? Authorizing Provider  diphenhydrAMINE (BENADRYL) 25 MG tablet Take 1 tablet (25 mg total) by mouth every 6 (six) hours as needed. 01/19/20   Horton, Barbette Hair, MD  EPINEPHrine 0.3 mg/0.3 mL IJ SOAJ injection Inject 0.3 mLs (0.3 mg total) into the muscle as needed for anaphylaxis. 01/19/20   Horton, Barbette Hair, MD  famotidine (PEPCID) 20 MG tablet Take 1 tablet (20 mg total) by mouth daily. 01/19/20   Horton, Barbette Hair, MD  ibuprofen (ADVIL) 600 MG tablet Take 1 tablet (600 mg total) by mouth every 6 (six) hours as needed. 03/26/20   Jacqlyn Larsen, PA-C  methocarbamol (ROBAXIN) 500 MG tablet Take 1 tablet (500 mg total) by mouth 2 (two) times daily. 03/26/20   Jacqlyn Larsen, PA-C  albuterol (PROVENTIL HFA;VENTOLIN HFA) 108 (90 Base) MCG/ACT inhaler Inhale 1-2 puffs into the lungs every 6 (six) hours as needed for wheezing or shortness of  breath. 12/18/18 12/12/19  Petrucelli, Pleas Koch, PA-C    Allergies    Shellfish allergy  Review of Systems   Review of Systems  Constitutional: Negative for chills and fever.  HENT: Negative.   Respiratory: Negative for cough and shortness of breath.   Cardiovascular: Negative for chest pain.  Gastrointestinal: Positive for abdominal pain. Negative for blood in stool, constipation, diarrhea, nausea and vomiting.  Genitourinary: Negative for dysuria, flank pain, frequency and hematuria.  Musculoskeletal: Negative for arthralgias, back pain and myalgias.  Skin: Negative for color change and rash.  Neurological: Negative for syncope and light-headedness.    Physical Exam Updated Vital Signs BP (!) 153/85 (BP Location: Right Arm)    Pulse 70   Temp 98.2 F (36.8 C) (Oral)   Resp 16   Ht 5\' 10"  (1.778 m)   Wt 83.9 kg   SpO2 98%   BMI 26.54 kg/m   Physical Exam Vitals and nursing note reviewed.  Constitutional:      General: He is not in acute distress.    Appearance: Normal appearance. He is well-developed and normal weight. He is not ill-appearing or diaphoretic.  HENT:     Head: Normocephalic and atraumatic.  Eyes:     General:        Right eye: No discharge.        Left eye: No discharge.     Pupils: Pupils are equal, round, and reactive to light.  Cardiovascular:     Rate and Rhythm: Normal rate and regular rhythm.     Heart sounds: Normal heart sounds. No murmur. No friction rub. No gallop.   Pulmonary:     Effort: Pulmonary effort is normal. No respiratory distress.     Breath sounds: Normal breath sounds. No wheezing or rales.  Chest:     Chest wall: Tenderness present.  Abdominal:     General: Bowel sounds are normal. There is no distension.     Palpations: Abdomen is soft. There is no mass.     Tenderness: There is abdominal tenderness. There is no guarding.     Comments: There is some mild tenderness noted in the left upper quadrant without guarding or rebound tenderness, all other quadrants are nontender to palpation, no distention, bowel sounds present throughout, no hernias or masses, no CVA tenderness bilaterally  Musculoskeletal:        General: No deformity.     Cervical back: Neck supple.     Comments: No back or flank tenderness  Skin:    General: Skin is warm and dry.     Capillary Refill: Capillary refill takes less than 2 seconds.  Neurological:     Mental Status: He is alert.     Coordination: Coordination normal.     Comments: Speech is clear, able to follow commands Moves extremities without ataxia, coordination intact  Psychiatric:        Mood and Affect: Mood normal.        Behavior: Behavior normal.     ED Results / Procedures / Treatments   Labs (all labs  ordered are listed, but only abnormal results are displayed) Labs Reviewed  URINALYSIS, ROUTINE W REFLEX MICROSCOPIC  COMPREHENSIVE METABOLIC PANEL  CBC WITH DIFFERENTIAL/PLATELET  LIPASE, BLOOD    EKG None  Radiology DG Abd Acute W/Chest  Result Date: 03/26/2020 CLINICAL DATA:  Left flank pain. EXAM: DG ABDOMEN ACUTE W/ 1V CHEST COMPARISON:  Chest x-ray 01/12/2020.  Lumbar spine 11/03/2013. FINDINGS: No acute cardiopulmonary disease. Soft  tissues of the abdomen are unremarkable. Stool noted throughout the colon. This makes evaluation for renal stone disease difficult. No bowel distention or free air. Tiny punctate calcifications in pelvis most likely phleboliths. IMPRESSION: No acute cardiopulmonary disease. No acute intra-abdominal abnormality identified. Electronically Signed   By: Maisie Fus  Register   On: 03/26/2020 14:46    Procedures Procedures (including critical care time)  Medications Ordered in ED Medications  ibuprofen (ADVIL) tablet 600 mg (600 mg Oral Given 03/26/20 1423)  methocarbamol (ROBAXIN) tablet 500 mg (500 mg Oral Given 03/26/20 1423)    ED Course  I have reviewed the triage vital signs and the nursing notes.  Pertinent labs & imaging results that were available during my care of the patient were reviewed by me and considered in my medical decision making (see chart for details).    MDM Rules/Calculators/A&P Patient presents to the ED with complaints of abdominal pain. Patient nontoxic appearing, in no apparent distress, vitals WNL . On exam patient tender to palpation in the left upper quadrant, tenderness is very mild, no peritoneal signs.  Patient is also able to elicit pain with certain movements.  Will evaluate with labs, given fairly benign abdominal exam will get acute abdominal series to assess for any potential rib pathology, or pneumonia and to assess bowel gas pattern, do not feel that CT abdomen pelvis is indicated. Analgesics administered.   I have  ordered, reviewed and interpreted all labs and imaging: CBC: No leukocytosis Normal hemoglobin  CMP: No acute electrolyte derangements, normal renal and liver function Lipase: WNL UA: No signs of infection, no hematuria to suggest renal stone Imaging: Acute abdominal series with no acute cardiopulmonary disease, and no acute intra-abdominal abnormality identified  On repeat abdominal exam patient remains without peritoneal signs, doubt cholecystitis, pancreatitis, diverticulitis, appendicitis, bowel obstruction/perforation.  Pain improved with NSAIDs and muscle relaxer, suspect pulled muscle from lifting heavy furniture.  Will discharge home with supportive measures. I discussed results, treatment plan, need for PCP follow-up, and return precautions with the patient. Provided opportunity for questions, patient confirmed understanding and is in agreement with plan.    Final Clinical Impression(s) / ED Diagnoses Final diagnoses:  Left upper quadrant abdominal pain    Rx / DC Orders ED Discharge Orders         Ordered    ibuprofen (ADVIL) 600 MG tablet  Every 6 hours PRN     03/26/20 1610    methocarbamol (ROBAXIN) 500 MG tablet  2 times daily     03/26/20 1610           Legrand Rams 03/26/20 1612    Jacalyn Lefevre, MD 03/28/20 [phone]

## 2020-03-26 NOTE — ED Triage Notes (Signed)
Pt c/o left flank pain x 1 day denies n/v

## 2020-04-01 ENCOUNTER — Encounter (HOSPITAL_BASED_OUTPATIENT_CLINIC_OR_DEPARTMENT_OTHER): Payer: Self-pay

## 2020-04-01 ENCOUNTER — Other Ambulatory Visit: Payer: Self-pay

## 2020-04-01 ENCOUNTER — Emergency Department (HOSPITAL_BASED_OUTPATIENT_CLINIC_OR_DEPARTMENT_OTHER)
Admission: EM | Admit: 2020-04-01 | Discharge: 2020-04-01 | Disposition: A | Discharge status: Home / Self Care | Payer: Self-pay | Attending: Emergency Medicine | Admitting: Emergency Medicine

## 2020-04-01 DIAGNOSIS — J45909 Unspecified asthma, uncomplicated: Secondary | ICD-10-CM | POA: Insufficient documentation

## 2020-04-01 DIAGNOSIS — Z87891 Personal history of nicotine dependence: Secondary | ICD-10-CM | POA: Insufficient documentation

## 2020-04-01 DIAGNOSIS — Z79899 Other long term (current) drug therapy: Secondary | ICD-10-CM | POA: Insufficient documentation

## 2020-04-01 DIAGNOSIS — J029 Acute pharyngitis, unspecified: Secondary | ICD-10-CM | POA: Insufficient documentation

## 2020-04-01 DIAGNOSIS — Z91013 Allergy to seafood: Secondary | ICD-10-CM | POA: Insufficient documentation

## 2020-04-01 LAB — GROUP A STREP BY PCR: Group A Strep by PCR: NOT DETECTED

## 2020-04-01 MED ORDER — LIDOCAINE VISCOUS HCL 2 % MT SOLN
15.0000 mL | Freq: Once | OROMUCOSAL | Status: AC
  Administered 2020-04-01: 16:00:00 15 mL via OROMUCOSAL
  Filled 2020-04-01: qty 15

## 2020-04-01 MED ORDER — NAPROXEN 250 MG PO TABS
500.0000 mg | ORAL_TABLET | Freq: Once | ORAL | Status: AC
  Administered 2020-04-01: 16:00:00 500 mg via ORAL
  Filled 2020-04-01: qty 2

## 2020-04-01 NOTE — ED Triage Notes (Signed)
Sore throat since last PM. Pt states "it feels like my tonsils are swollen." Pt reports associated nasal congestion.

## 2020-04-01 NOTE — Discharge Instructions (Addendum)
Your sore throat may be due to a virus, your smoking could be contributing to your symptoms as well.  You can use ibuprofen and Tylenol to help with pain as well as cepacol throat lozenges.   You can talk to your primary care provider about help quitting smoking.  Return to the ED for worsening sore throat, difficulty breathing or swallowing, fevers or any other new or concerning symptoms.

## 2020-04-01 NOTE — ED Provider Notes (Signed)
[facility] EMERGENCY DEPARTMENT Provider Note   CSN: [id number] Arrival date & time: 04/01/20  1404     History Chief Complaint  Patient presents with  . Sore Throat    Curtis Paul is a 24 y.o. male.  Curtis Paul is a 24 y.o. male with a history of asthma, who presents to the ED for evaluation of sore throat and feeling like his tonsils are swollen.  He reports symptoms began last night.  He denies any difficulty swallowing.  He has been able to eat and drink without difficulty.  No associated fevers.  No neck pain or swelling.  No change in his voice.  He denies any associated rhinorrhea, nasal congestion or cough.  No nausea or vomiting.  States he thinks this may be from him smoking.  He denies any known sick contacts.  No other aggravating or alleviating factors.        Past Medical History:  Diagnosis Date  . Asthma     There are no problems to display for this patient.   History reviewed. No pertinent surgical history.     No family history on file.  Social History   Tobacco Use  . Smoking status: Former Smoker    Types: Cigars  . Smokeless tobacco: Never Used  . Tobacco comment: Black and Mouse  Substance Use Topics  . Alcohol use: Yes  . Drug use: Yes    Types: Marijuana    Comment: last smoked 2 days ago    Home Medications Prior to Admission medications   Medication Sig Start Date End Date Taking? Authorizing Provider  diphenhydrAMINE (BENADRYL) 25 MG tablet Take 1 tablet (25 mg total) by mouth every 6 (six) hours as needed. 01/19/20   Horton, Barbette Hair, MD  EPINEPHrine 0.3 mg/0.3 mL IJ SOAJ injection Inject 0.3 mLs (0.3 mg total) into the muscle as needed for anaphylaxis. 01/19/20   Horton, Barbette Hair, MD  famotidine (PEPCID) 20 MG tablet Take 1 tablet (20 mg total) by mouth daily. 01/19/20   Horton, Barbette Hair, MD  ibuprofen (ADVIL) 600 MG tablet Take 1 tablet (600 mg total) by mouth every 6 (six) hours as needed. 03/26/20   Jacqlyn Larsen, PA-C  methocarbamol (ROBAXIN) 500 MG tablet Take 1 tablet (500 mg total) by mouth 2 (two) times daily. 03/26/20   Jacqlyn Larsen, PA-C  albuterol (PROVENTIL HFA;VENTOLIN HFA) 108 (90 Base) MCG/ACT inhaler Inhale 1-2 puffs into the lungs every 6 (six) hours as needed for wheezing or shortness of breath. 12/18/18 12/12/19  Petrucelli, Glynda Jaeger, PA-C    Allergies    Shellfish allergy  Review of Systems   Review of Systems  Constitutional: Negative for chills and fever.  HENT: Positive for sore throat. Negative for congestion, rhinorrhea, trouble swallowing and voice change.   Respiratory: Negative for cough and shortness of breath.   Cardiovascular: Negative for chest pain.  Gastrointestinal: Negative for abdominal pain, nausea and vomiting.  Musculoskeletal: Negative for neck pain and neck stiffness.  Skin: Negative for color change and rash.  Neurological: Negative for dizziness, syncope and light-headedness.    Physical Exam Updated Vital Signs BP 121/82 (BP Location: Right Arm)   Pulse 60   Temp 98.5 F (36.9 C) (Oral)   Resp 14   Ht 5\' 10"  (1.778 m)   Wt 83.9 kg   SpO2 100%   BMI 26.54 kg/m   Physical Exam Vitals and nursing note reviewed.  Constitutional:  General: He is not in acute distress.    Appearance: He is well-developed and normal weight. He is not ill-appearing or diaphoretic.     Comments: Well-appearing and in no distress  HENT:     Head: Normocephalic and atraumatic.     Nose: No congestion or rhinorrhea.     Mouth/Throat:     Mouth: Mucous membranes are moist. No oral lesions.     Pharynx: Uvula midline. Posterior oropharyngeal erythema present. No pharyngeal swelling, oropharyngeal exudate or uvula swelling.     Tonsils: No tonsillar exudate or tonsillar abscesses. 0 on the right. 0 on the left.     Comments: Posterior oropharynx clear and mucous membranes moist, there is mild erythema but no edema or tonsillar exudates, uvula midline,  normal phonation, no trismus, tolerating secretions without difficulty. Eyes:     General:        Right eye: No discharge.        Left eye: No discharge.     Pupils: Pupils are equal, round, and reactive to light.  Cardiovascular:     Rate and Rhythm: Normal rate and regular rhythm.     Heart sounds: Normal heart sounds.  Pulmonary:     Effort: Pulmonary effort is normal. No respiratory distress.     Breath sounds: Normal breath sounds. No wheezing or rales.     Comments: Respirations equal and unlabored, patient able to speak in full sentences, lungs clear to auscultation bilaterally Musculoskeletal:        General: No deformity.     Cervical back: Neck supple.  Skin:    General: Skin is warm and dry.     Capillary Refill: Capillary refill takes less than 2 seconds.  Neurological:     Mental Status: He is alert.     Coordination: Coordination normal.     Comments: Speech is clear, able to follow commands Moves extremities without ataxia, coordination intact  Psychiatric:        Mood and Affect: Mood normal.        Behavior: Behavior normal.     ED Results / Procedures / Treatments   Labs (all labs ordered are listed, but only abnormal results are displayed) Labs Reviewed  GROUP A STREP BY PCR    EKG None  Radiology No results found.  Procedures Procedures (including critical care time)  Medications Ordered in ED Medications  naproxen (NAPROSYN) tablet 500 mg (500 mg Oral Given 04/01/20 1616)  lidocaine (XYLOCAINE) 2 % viscous mouth solution 15 mL (15 mLs Mouth/Throat Given 04/01/20 1617)    ED Course  I have reviewed the triage vital signs and the nursing notes.  Pertinent labs & imaging results that were available during my care of the patient were reviewed by me and considered in my medical decision making (see chart for details).    MDM Rules/Calculators/A&P                     Pt afebrile without tonsillar exudate, negative strep. Presents with mild  cervical lymphadenopathy, & dysphagia; diagnosis of viral pharyngitis. No abx indicated. Discharged with symptomatic tx for pain  Pt does not appear dehydrated, but did discuss importance of water rehydration. Presentation non concerning for PTA or RPA. No trismus or uvula deviation. Specific return precautions discussed. Pt able to drink water in ED without difficulty with intact air way. Recommended PCP follow up.  Counseled patient on smoking cessation.  Final Clinical Impression(s) / ED Diagnoses Final diagnoses:  Pharyngitis, unspecified etiology    Rx / DC Orders ED Discharge Orders    None       Legrand Rams 04/01/20 1700    Alvira Monday, MD 04/03/20 1440

## 2020-04-19 ENCOUNTER — Other Ambulatory Visit: Payer: Self-pay

## 2020-04-19 ENCOUNTER — Encounter (HOSPITAL_BASED_OUTPATIENT_CLINIC_OR_DEPARTMENT_OTHER): Payer: Self-pay | Admitting: *Deleted

## 2020-04-19 ENCOUNTER — Emergency Department (HOSPITAL_BASED_OUTPATIENT_CLINIC_OR_DEPARTMENT_OTHER)
Admission: EM | Admit: 2020-04-19 | Discharge: 2020-04-19 | Disposition: A | Discharge status: Home / Self Care | Payer: Self-pay | Attending: Emergency Medicine | Admitting: Emergency Medicine

## 2020-04-19 DIAGNOSIS — J029 Acute pharyngitis, unspecified: Secondary | ICD-10-CM | POA: Insufficient documentation

## 2020-04-19 DIAGNOSIS — Z20822 Contact with and (suspected) exposure to covid-19: Secondary | ICD-10-CM | POA: Insufficient documentation

## 2020-04-19 DIAGNOSIS — J45909 Unspecified asthma, uncomplicated: Secondary | ICD-10-CM | POA: Insufficient documentation

## 2020-04-19 DIAGNOSIS — J069 Acute upper respiratory infection, unspecified: Secondary | ICD-10-CM | POA: Insufficient documentation

## 2020-04-19 LAB — SARS CORONAVIRUS 2 BY RT PCR (HOSPITAL ORDER, PERFORMED IN ~~LOC~~ HOSPITAL LAB): SARS Coronavirus 2: NEGATIVE

## 2020-04-19 LAB — GROUP A STREP BY PCR: Group A Strep by PCR: NOT DETECTED

## 2020-04-19 NOTE — ED Provider Notes (Signed)
[place] EMERGENCY DEPARTMENT Provider Note   CSN: [id number] Arrival date & time: 04/19/20  1335     History Chief Complaint  Patient presents with  . Cough    Curtis Paul is a 24 y.o. male.  24 year old male presents with complaint of cough with clear mucus as well as sore throat.  Symptoms started yesterday while at work.  Denies fevers, shortness of breath, body aches, loss of smell or taste.  No known exposure to Covid.  No other complaints or concerns today.  Curtis Paul was evaluated in Emergency Department on 04/19/2020 for the symptoms described in the history of present illness. He was evaluated in the context of the global COVID-19 pandemic, which necessitated consideration that the patient might be at risk for infection with the SARS-CoV-2 virus that causes COVID-19. Institutional protocols and algorithms that pertain to the evaluation of patients at risk for COVID-19 are in a state of rapid change based on information released by regulatory bodies including the CDC and federal and state organizations. These policies and algorithms were followed during the patient's care in the ED.         Past Medical History:  Diagnosis Date  . Asthma     There are no problems to display for this patient.   History reviewed. No pertinent surgical history.     No family history on file.  Social History   Tobacco Use  . Smoking status: Former Smoker    Types: Cigars  . Smokeless tobacco: Never Used  . Tobacco comment: Black and Mouse  Substance Use Topics  . Alcohol use: Yes  . Drug use: Yes    Types: Marijuana    Comment: last smoked 2 days ago    Home Medications Prior to Admission medications   Medication Sig Start Date End Date Taking? Authorizing Provider  diphenhydrAMINE (BENADRYL) 25 MG tablet Take 1 tablet (25 mg total) by mouth every 6 (six) hours as needed. 01/19/20   Horton, Barbette Hair, MD  EPINEPHrine 0.3 mg/0.3 mL IJ SOAJ  injection Inject 0.3 mLs (0.3 mg total) into the muscle as needed for anaphylaxis. 01/19/20   Horton, Barbette Hair, MD  famotidine (PEPCID) 20 MG tablet Take 1 tablet (20 mg total) by mouth daily. 01/19/20   Horton, Barbette Hair, MD  ibuprofen (ADVIL) 600 MG tablet Take 1 tablet (600 mg total) by mouth every 6 (six) hours as needed. 03/26/20   Jacqlyn Larsen, PA-C  methocarbamol (ROBAXIN) 500 MG tablet Take 1 tablet (500 mg total) by mouth 2 (two) times daily. 03/26/20   Jacqlyn Larsen, PA-C  albuterol (PROVENTIL HFA;VENTOLIN HFA) 108 (90 Base) MCG/ACT inhaler Inhale 1-2 puffs into the lungs every 6 (six) hours as needed for wheezing or shortness of breath. 12/18/18 12/12/19  Petrucelli, Glynda Jaeger, PA-C    Allergies    Shellfish allergy  Review of Systems   Review of Systems  Constitutional: Negative for fever.  HENT: Positive for sore throat. Negative for congestion, ear discharge, ear pain, rhinorrhea, sinus pressure, sinus pain, sneezing, trouble swallowing and voice change.   Eyes: Negative for photophobia.  Respiratory: Positive for cough. Negative for shortness of breath.   Gastrointestinal: Negative for diarrhea, nausea and vomiting.  Musculoskeletal: Negative for arthralgias and myalgias.  Skin: Negative for rash and wound.  Allergic/Immunologic: Negative for immunocompromised state.  Neurological: Negative for weakness and headaches.  Hematological: Negative for adenopathy.  All other systems reviewed and are negative.   Physical Exam  Updated Vital Signs BP 113/66   Pulse 70   Temp 98.6 F (37 C) (Oral)   Resp 14   Ht 5\' 10"  (1.778 m)   Wt 83.9 kg   SpO2 99%   BMI 26.54 kg/m   Physical Exam Vitals and nursing note reviewed.  Constitutional:      General: He is not in acute distress.    Appearance: He is well-developed. He is not diaphoretic.  HENT:     Head: Normocephalic and atraumatic.     Right Ear: Tympanic membrane and ear canal normal.     Left Ear: Tympanic  membrane and ear canal normal.     Nose: Nose normal.     Mouth/Throat:     Mouth: Mucous membranes are moist.     Pharynx: Uvula midline. Posterior oropharyngeal erythema present. No oropharyngeal exudate or uvula swelling.     Tonsils: No tonsillar exudate. 0 on the right. 0 on the left.  Eyes:     Extraocular Movements: Extraocular movements intact.     Conjunctiva/sclera: Conjunctivae normal.     Pupils: Pupils are equal, round, and reactive to light.  Cardiovascular:     Rate and Rhythm: Normal rate and regular rhythm.     Pulses: Normal pulses.     Heart sounds: Normal heart sounds.  Pulmonary:     Effort: Pulmonary effort is normal.     Breath sounds: Normal breath sounds.  Musculoskeletal:     Cervical back: Neck supple. No tenderness.  Lymphadenopathy:     Cervical: No cervical adenopathy.  Skin:    General: Skin is warm and dry.     Findings: No erythema or rash.  Neurological:     Mental Status: He is alert and oriented to person, place, and time.  Psychiatric:        Behavior: Behavior normal.     ED Results / Procedures / Treatments   Labs (all labs ordered are listed, but only abnormal results are displayed) Labs Reviewed  GROUP A STREP BY PCR  SARS CORONAVIRUS 2 BY RT PCR (HOSPITAL ORDER, PERFORMED IN [facility])    EKG None  Radiology No results found.  Procedures Procedures (including critical care time)  Medications Ordered in ED Medications - No data to display  ED Course  I have reviewed the triage vital signs and the nursing notes.  Pertinent labs & imaging results that were available during my care of the patient were reviewed by me and considered in my medical decision making (see chart for details).  Clinical Course as of Apr 19 1541  Mon Apr 19, 2020  2356 24 year old male with complaint of sore throat with cough onset yesterday.  Exam, is well-appearing, has mild erythema to the oropharynx is not visualized, no exudate.   Lung sounds are clear.  Vitals are reassuring.  Covid test is pending, rapid strep test is negative.   [LM]    Clinical Course User Index [LM] 36   MDM Rules/Calculators/A&P                      Final Clinical Impression(s) / ED Diagnoses Final diagnoses:  Viral URI with cough    Rx / DC Orders ED Discharge Orders    None       Alden Hipp, PA-C 04/19/20 1542    04/21/20, MD 04/20/20 [phone]

## 2020-04-19 NOTE — ED Notes (Signed)
ED Provider at bedside. 

## 2020-04-19 NOTE — ED Triage Notes (Signed)
Cough since yesterday.   

## 2020-04-19 NOTE — Discharge Instructions (Signed)
Your strep test is negative. Your Covid test is pending.  This should be available later today in your MyChart account. If your Covid test is negative, you may return to work.  If your Covid test is positive, you will need to quarantine for 10 days and may return to work on May 26th.

## 2020-05-10 ENCOUNTER — Emergency Department (HOSPITAL_BASED_OUTPATIENT_CLINIC_OR_DEPARTMENT_OTHER)
Admission: EM | Admit: 2020-05-10 | Discharge: 2020-05-10 | Disposition: A | Discharge status: Home / Self Care | Payer: Self-pay | Attending: Emergency Medicine | Admitting: Emergency Medicine

## 2020-05-10 ENCOUNTER — Other Ambulatory Visit: Payer: Self-pay

## 2020-05-10 ENCOUNTER — Encounter (HOSPITAL_BASED_OUTPATIENT_CLINIC_OR_DEPARTMENT_OTHER): Payer: Self-pay

## 2020-05-10 DIAGNOSIS — Z87891 Personal history of nicotine dependence: Secondary | ICD-10-CM | POA: Insufficient documentation

## 2020-05-10 DIAGNOSIS — R519 Headache, unspecified: Secondary | ICD-10-CM | POA: Insufficient documentation

## 2020-05-10 MED ORDER — IBUPROFEN 800 MG PO TABS
800.0000 mg | ORAL_TABLET | Freq: Once | ORAL | Status: AC
  Administered 2020-05-10: 800 mg via ORAL
  Filled 2020-05-10: qty 1

## 2020-05-10 NOTE — ED Triage Notes (Signed)
Pt states while at work, began having headache, sent by employer for evaluation.  States drinking fluids, but has not had anything to eat today.  Denies n/v, denies vision changes.

## 2020-05-10 NOTE — ED Notes (Signed)
ED Provider at bedside. 

## 2020-05-10 NOTE — ED Notes (Signed)
Pt tolerating po food/fluids.  Lying flat pending orthostatic VS

## 2020-05-10 NOTE — Discharge Instructions (Addendum)
Make sure you are eating regular meals.  Use tylenol or ibuprofen if you develop a headache.  Follow up with your primary care doctor if you continue to have headaches. Return to the ER if you develop high fevers, vision changes, slurred speech, numbness/weakness, or any new, worsening, or concerning symptoms.

## 2020-05-10 NOTE — ED Provider Notes (Signed)
[facility] EMERGENCY DEPARTMENT Provider Note   CSN: [id number] Arrival date & time: 05/10/20  8315     History Chief Complaint  Patient presents with  . Headache    Curtis Paul is a 24 y.o. male presenting for evaluation of HA.   Pt states he was at work when he developed a headache.  It is global, not located in one spot.  Began about an hour ago.  Since then, headache has gradually improved without intervention.  He denies associated fever, neck stiffness, vision changes, slurred speech, numbness, tingling, nausea, vomiting, photophobia, phonophobia.  He has not taken anything including Tylenol ibuprofen.  Denies a history of headaches.  Patient states he has been trying to increase his fluid intake, thus has been drinking a lot of water today, however has not had anything to eat today.  This is abnormal for him.  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  He denies dizziness or lightheadedness.  Denies cough, chest pain, shortness of breath, abdominal pain, urinary symptoms, normal bowel movements.  He has no medical problems, takes no medications daily.  HPI     Past Medical History:  Diagnosis Date  . Asthma     There are no problems to display for this patient.   History reviewed. No pertinent surgical history.     History reviewed. No pertinent family history.  Social History   Tobacco Use  . Smoking status: Former Smoker    Types: Cigars  . Smokeless tobacco: Never Used  . Tobacco comment: Black and Mouse  Substance Use Topics  . Alcohol use: Yes  . Drug use: Yes    Types: Marijuana    Comment: last smoked 2 days ago    Home Medications Prior to Admission medications   Medication Sig Start Date End Date Taking? Authorizing Provider  diphenhydrAMINE (BENADRYL) 25 MG tablet Take 1 tablet (25 mg total) by mouth every 6 (six) hours as needed. 01/19/20   Horton,  Mayer Masker, MD  EPINEPHrine 0.3 mg/0.3 mL IJ SOAJ injection Inject 0.3 mLs (0.3 mg total) into the muscle as needed for anaphylaxis. 01/19/20   Horton, Mayer Masker, MD  famotidine (PEPCID) 20 MG tablet Take 1 tablet (20 mg total) by mouth daily. 01/19/20   Horton, Mayer Masker, MD  ibuprofen (ADVIL) 600 MG tablet Take 1 tablet (600 mg total) by mouth every 6 (six) hours as needed. 03/26/20   Dartha Lodge, PA-C  methocarbamol (ROBAXIN) 500 MG tablet Take 1 tablet (500 mg total) by mouth 2 (two) times daily. 03/26/20   Dartha Lodge, PA-C  albuterol (PROVENTIL HFA;VENTOLIN HFA) 108 (90 Base) MCG/ACT inhaler Inhale 1-2 puffs into the lungs every 6 (six) hours as needed for wheezing or shortness of breath. 12/18/18 12/12/19  Petrucelli, Pleas Koch, PA-C    Allergies    Shellfish allergy  Review of Systems   Review of Systems  Neurological: Positive for headaches (improving).  All other systems reviewed and are negative.   Physical Exam Updated Vital Signs BP 139/77 (BP Location: Right Arm)   Pulse 66   Temp 98.8 F (37.1 C) (Oral)   Resp 16   Ht 5\' 11"  (1.803 m)   Wt 81.6 kg   SpO2 99%   BMI 25.10 kg/m   Physical Exam Vitals and nursing note reviewed.  Constitutional:      General: He is not in acute  distress.    Appearance: He is well-developed.     Comments: Resting comfortably in the bed in no acute distress  HENT:     Head: Normocephalic and atraumatic.  Eyes:     Extraocular Movements: Extraocular movements intact.     Conjunctiva/sclera: Conjunctivae normal.     Pupils: Pupils are equal, round, and reactive to light.     Comments: EOMI and PERRLA  Neck:     Comments: Moving head without signs of pain.  No neck tenderness. Cardiovascular:     Rate and Rhythm: Normal rate and regular rhythm.     Pulses: Normal pulses.  Pulmonary:     Effort: Pulmonary effort is normal. No respiratory distress.     Breath sounds: Normal breath sounds. No wheezing.  Abdominal:     General:  There is no distension.     Palpations: Abdomen is soft. There is no mass.     Tenderness: There is no abdominal tenderness. There is no guarding or rebound.  Musculoskeletal:        General: Normal range of motion.     Cervical back: Normal range of motion and neck supple.  Skin:    General: Skin is warm and dry.     Capillary Refill: Capillary refill takes less than 2 seconds.  Neurological:     General: No focal deficit present.     Mental Status: He is alert and oriented to person, place, and time.     GCS: GCS eye subscore is 4. GCS verbal subscore is 5. GCS motor subscore is 6.     Cranial Nerves: Cranial nerves are intact.     Sensory: Sensation is intact.     Motor: Motor function is intact.     Coordination: Coordination is intact.     Comments: CN intact.  Nose to finger intact.  Fine movement and coronation intact.  Strength and sensation intact x4.     ED Results / Procedures / Treatments   Labs (all labs ordered are listed, but only abnormal results are displayed) Labs Reviewed - No data to display  EKG None  Radiology No results found.  Procedures Procedures (including critical care time)  Medications Ordered in ED Medications  ibuprofen (ADVIL) tablet 800 mg (800 mg Oral Given 05/10/20 1006)    ED Course  I have reviewed the triage vital signs and the nursing notes.  Pertinent labs & imaging results that were available during my care of the patient were reviewed by me and considered in my medical decision making (see chart for details).    MDM Rules/Calculators/A&P                      Patient presented for evaluation of headache.  On exam, patient appears nontoxic.  He has no neurologic deficits.  Consider lack of p.o. intake.  Consider headache due to new job requires lots of physical activity/soreness.  Exam is not consistent with infectious symptoms such as meningitis.  With improving symptoms and no neurologic deficits, doubt SAH, ICH, TIA, CVA,  dural sinus thrombosis.  We will give p.o., ibuprofen, check orthostatics.  If patient is feeling better, plan for discharge.  On reassessment, headache has completely resolved.  Orthostatics negative for significant change.  Discussed continued symptomatic treatment with Tylenol and ibuprofen.  Follow-up with PCP as needed.  Encouraged regular meals.  At this time, patient appears safe for discharge.  Return precautions given.  Patient states he understands and agrees  plan.  Final Clinical Impression(s) / ED Diagnoses Final diagnoses:  Acute nonintractable headache, unspecified headache type    Rx / DC Orders ED Discharge Orders    None       Franchot Heidelberg, PA-C 05/10/20 [place], DO 05/10/20 1121

## 2020-05-13 ENCOUNTER — Other Ambulatory Visit: Payer: Self-pay

## 2020-05-13 ENCOUNTER — Emergency Department (HOSPITAL_BASED_OUTPATIENT_CLINIC_OR_DEPARTMENT_OTHER)
Admission: EM | Admit: 2020-05-13 | Discharge: 2020-05-13 | Disposition: A | Discharge status: Home / Self Care | Payer: Self-pay | Attending: Emergency Medicine | Admitting: Emergency Medicine

## 2020-05-13 ENCOUNTER — Emergency Department (HOSPITAL_BASED_OUTPATIENT_CLINIC_OR_DEPARTMENT_OTHER): Payer: Self-pay

## 2020-05-13 ENCOUNTER — Emergency Department (HOSPITAL_BASED_OUTPATIENT_CLINIC_OR_DEPARTMENT_OTHER): Admission: EM | Admit: 2020-05-13 | Discharge: 2020-05-13 | Discharge status: Against Medical Advice | Payer: Self-pay

## 2020-05-13 ENCOUNTER — Encounter (HOSPITAL_BASED_OUTPATIENT_CLINIC_OR_DEPARTMENT_OTHER): Payer: Self-pay

## 2020-05-13 DIAGNOSIS — F1210 Cannabis abuse, uncomplicated: Secondary | ICD-10-CM | POA: Insufficient documentation

## 2020-05-13 DIAGNOSIS — Y929 Unspecified place or not applicable: Secondary | ICD-10-CM | POA: Insufficient documentation

## 2020-05-13 DIAGNOSIS — J45909 Unspecified asthma, uncomplicated: Secondary | ICD-10-CM | POA: Insufficient documentation

## 2020-05-13 DIAGNOSIS — Y998 Other external cause status: Secondary | ICD-10-CM | POA: Insufficient documentation

## 2020-05-13 DIAGNOSIS — Z87891 Personal history of nicotine dependence: Secondary | ICD-10-CM | POA: Insufficient documentation

## 2020-05-13 DIAGNOSIS — Y9389 Activity, other specified: Secondary | ICD-10-CM | POA: Insufficient documentation

## 2020-05-13 DIAGNOSIS — W228XXA Striking against or struck by other objects, initial encounter: Secondary | ICD-10-CM | POA: Insufficient documentation

## 2020-05-13 DIAGNOSIS — S6991XA Unspecified injury of right wrist, hand and finger(s), initial encounter: Secondary | ICD-10-CM | POA: Insufficient documentation
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
## 2020-05-13 NOTE — Discharge Instructions (Signed)
You were seen in the emergency room today with a hand injury.  Your lacerations are all superficial and do not require stitches.  Please keep them clean and dry.  Take Tylenol and or Motrin as needed for pain.  Follow with your primary care doctor in the coming week for wound reevaluation.  Return to the emergency department if you develop redness in the fingers, drainage from the wounds, severe pain or swelling.
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
## 2020-09-23 ENCOUNTER — Encounter (HOSPITAL_COMMUNITY): Admission: EM | Disposition: A | Discharge status: Home / Self Care | Payer: Self-pay | Source: Home / Self Care

## 2020-09-23 ENCOUNTER — Emergency Department (HOSPITAL_COMMUNITY): Payer: Self-pay

## 2020-09-23 ENCOUNTER — Inpatient Hospital Stay (HOSPITAL_COMMUNITY): Payer: Self-pay | Admitting: Certified Registered"

## 2020-09-23 ENCOUNTER — Inpatient Hospital Stay (HOSPITAL_COMMUNITY): Payer: Self-pay

## 2020-09-23 ENCOUNTER — Encounter (HOSPITAL_COMMUNITY): Payer: Self-pay

## 2020-09-23 ENCOUNTER — Inpatient Hospital Stay (HOSPITAL_COMMUNITY)
Admission: EM | Admit: 2020-09-23 | Discharge: 2020-09-26 | DRG: 956 | Disposition: A | Discharge status: Home / Self Care | Payer: Self-pay | Attending: General Surgery | Admitting: General Surgery

## 2020-09-23 DIAGNOSIS — S0181XA Laceration without foreign body of other part of head, initial encounter: Secondary | ICD-10-CM | POA: Diagnosis present

## 2020-09-23 DIAGNOSIS — S27329A Contusion of lung, unspecified, initial encounter: Secondary | ICD-10-CM | POA: Diagnosis not present

## 2020-09-23 DIAGNOSIS — S27322A Contusion of lung, bilateral, initial encounter: Secondary | ICD-10-CM | POA: Diagnosis present

## 2020-09-23 DIAGNOSIS — S32409A Unspecified fracture of unspecified acetabulum, initial encounter for closed fracture: Secondary | ICD-10-CM

## 2020-09-23 DIAGNOSIS — Y92410 Unspecified street and highway as the place of occurrence of the external cause: Secondary | ICD-10-CM | POA: Diagnosis not present

## 2020-09-23 DIAGNOSIS — Z91013 Allergy to seafood: Secondary | ICD-10-CM

## 2020-09-23 DIAGNOSIS — T07XXXA Unspecified multiple injuries, initial encounter: Secondary | ICD-10-CM

## 2020-09-23 DIAGNOSIS — S01511A Laceration without foreign body of lip, initial encounter: Secondary | ICD-10-CM | POA: Diagnosis present

## 2020-09-23 DIAGNOSIS — S32401A Unspecified fracture of right acetabulum, initial encounter for closed fracture: Secondary | ICD-10-CM | POA: Diagnosis present

## 2020-09-23 DIAGNOSIS — Z20822 Contact with and (suspected) exposure to covid-19: Secondary | ICD-10-CM | POA: Diagnosis present

## 2020-09-23 DIAGNOSIS — Z23 Encounter for immunization: Secondary | ICD-10-CM | POA: Diagnosis not present

## 2020-09-23 DIAGNOSIS — E876 Hypokalemia: Secondary | ICD-10-CM | POA: Diagnosis present

## 2020-09-23 DIAGNOSIS — S73014A Posterior dislocation of right hip, initial encounter: Secondary | ICD-10-CM | POA: Diagnosis present

## 2020-09-23 DIAGNOSIS — S80211A Abrasion, right knee, initial encounter: Secondary | ICD-10-CM | POA: Diagnosis present

## 2020-09-23 DIAGNOSIS — S37031A Laceration of right kidney, unspecified degree, initial encounter: Secondary | ICD-10-CM | POA: Diagnosis present

## 2020-09-23 DIAGNOSIS — S32421A Displaced fracture of posterior wall of right acetabulum, initial encounter for closed fracture: Principal | ICD-10-CM

## 2020-09-23 DIAGNOSIS — S73004A Unspecified dislocation of right hip, initial encounter: Secondary | ICD-10-CM

## 2020-09-23 DIAGNOSIS — Z419 Encounter for procedure for purposes other than remedying health state, unspecified: Secondary | ICD-10-CM

## 2020-09-23 HISTORY — PX: OPEN REDUCTION INTERNAL FIXATION ACETABULUM POSTERIOR LATERAL: SHX6834

## 2020-09-23 LAB — I-STAT CHEM 8, ED
BUN: 8 mg/dL (ref 6–20)
Calcium, Ion: 1.11 mmol/L — ABNORMAL LOW (ref 1.15–1.40)
Chloride: 101 mmol/L (ref 98–111)
Creatinine, Ser: 0.8 mg/dL (ref 0.61–1.24)
Glucose, Bld: 144 mg/dL — ABNORMAL HIGH (ref 70–99)
HCT: 47 % (ref 39.0–52.0)
Hemoglobin: 16 g/dL (ref 13.0–17.0)
Potassium: 2.7 mmol/L — CL (ref 3.5–5.1)
Sodium: 140 mmol/L (ref 135–145)
TCO2: 24 mmol/L (ref 22–32)

## 2020-09-23 LAB — CBC
HCT: 46 % (ref 39.0–52.0)
Hemoglobin: 14.7 g/dL (ref 13.0–17.0)
MCH: 27.4 pg (ref 26.0–34.0)
MCHC: 32 g/dL (ref 30.0–36.0)
MCV: 85.8 fL (ref 80.0–100.0)
Platelets: 245 10*3/uL (ref 150–400)
RBC: 5.36 MIL/uL (ref 4.22–5.81)
RDW: 14.1 % (ref 11.5–15.5)
WBC: 14.2 10*3/uL — ABNORMAL HIGH (ref 4.0–10.5)
nRBC: 0 % (ref 0.0–0.2)

## 2020-09-23 LAB — SAMPLE TO BLOOD BANK

## 2020-09-23 LAB — COMPREHENSIVE METABOLIC PANEL
ALT: 42 U/L (ref 0–44)
AST: 71 U/L — ABNORMAL HIGH (ref 15–41)
Albumin: 3.7 g/dL (ref 3.5–5.0)
Alkaline Phosphatase: 46 U/L (ref 38–126)
Anion gap: 12 (ref 5–15)
BUN: 7 mg/dL (ref 6–20)
CO2: 23 mmol/L (ref 22–32)
Calcium: 8.8 mg/dL — ABNORMAL LOW (ref 8.9–10.3)
Chloride: 103 mmol/L (ref 98–111)
Creatinine, Ser: 0.91 mg/dL (ref 0.61–1.24)
GFR, Estimated: >60 mL/min (ref >60)
Glucose, Bld: 148 mg/dL — ABNORMAL HIGH (ref 70–99)
Potassium: 2.8 mmol/L — ABNORMAL LOW (ref 3.5–5.1)
Sodium: 138 mmol/L (ref 135–145)
Total Bilirubin: 0.9 mg/dL (ref 0.3–1.2)
Total Protein: 6.9 g/dL (ref 6.5–8.1)

## 2020-09-23 LAB — RESPIRATORY PANEL BY RT PCR (FLU A&B, COVID)
Influenza A by PCR: NEGATIVE
Influenza B by PCR: NEGATIVE
SARS Coronavirus 2 by RT PCR: NEGATIVE

## 2020-09-23 LAB — PROTIME-INR
INR: 1 (ref 0.8–1.2)
Prothrombin Time: 12.6 seconds (ref 11.4–15.2)

## 2020-09-23 LAB — LACTIC ACID, PLASMA: Lactic Acid, Venous: 2.8 mmol/L (ref 0.5–1.9)

## 2020-09-23 LAB — ETHANOL: Alcohol, Ethyl (B): <10 mg/dL (ref <10)

## 2020-09-23 SURGERY — OPEN REDUCTION INTERNAL FIXATION ACETABULUM POSTERIOR LATERAL
Anesthesia: General | Site: Pelvis | Laterality: Right

## 2020-09-23 MED ORDER — OXYCODONE HCL 5 MG PO TABS: 10.0000 mg | ORAL_TABLET | ORAL | Status: DC | PRN

## 2020-09-23 MED ORDER — METOCLOPRAMIDE HCL 5 MG PO TABS
5.0000 mg | ORAL_TABLET | Freq: Three times a day (TID) | ORAL | Status: DC | PRN
  Filled 2020-09-23: qty 2

## 2020-09-23 MED ORDER — ROCURONIUM BROMIDE 100 MG/10ML IV SOLN
INTRAVENOUS | Status: DC | PRN
  Administered 2020-09-23: 100 mg via INTRAVENOUS
  Administered 2020-09-23: 20 mg via INTRAVENOUS

## 2020-09-23 MED ORDER — CEFAZOLIN SODIUM-DEXTROSE 2-4 GM/100ML-% IV SOLN
2.0000 g | Freq: Three times a day (TID) | INTRAVENOUS | Status: AC
  Administered 2020-09-24 (×2): 2 g via INTRAVENOUS
  Filled 2020-09-23 (×2): qty 100

## 2020-09-23 MED ORDER — METHOCARBAMOL 500 MG PO TABS
1000.0000 mg | ORAL_TABLET | Freq: Three times a day (TID) | ORAL | Status: DC | PRN
  Administered 2020-09-24 – 2020-09-26 (×3): 1000 mg via ORAL
  Filled 2020-09-23 (×3): qty 2

## 2020-09-23 MED ORDER — ACETAMINOPHEN 500 MG PO TABS
1000.0000 mg | ORAL_TABLET | Freq: Four times a day (QID) | ORAL | Status: DC
  Administered 2020-09-23 – 2020-09-26 (×12): 1000 mg via ORAL
  Filled 2020-09-23 (×12): qty 2

## 2020-09-23 MED ORDER — PHENYLEPHRINE 40 MCG/ML (10ML) SYRINGE FOR IV PUSH (FOR BLOOD PRESSURE SUPPORT)
PREFILLED_SYRINGE | INTRAVENOUS | Status: AC
  Filled 2020-09-23: qty 10

## 2020-09-23 MED ORDER — MORPHINE SULFATE (PF) 2 MG/ML IV SOLN: 1.0000 mg | INTRAVENOUS | Status: DC | PRN

## 2020-09-23 MED ORDER — LIDOCAINE-EPINEPHRINE 1 %-1:100000 IJ SOLN: 10.0000 mL | Freq: Once | INTRAMUSCULAR | Status: DC

## 2020-09-23 MED ORDER — LACTATED RINGERS IV SOLN: INTRAVENOUS | Status: DC | PRN

## 2020-09-23 MED ORDER — CEFAZOLIN SODIUM-DEXTROSE 2-3 GM-%(50ML) IV SOLR
INTRAVENOUS | Status: DC | PRN
  Administered 2020-09-23: 2 g via INTRAVENOUS

## 2020-09-23 MED ORDER — OXYCODONE HCL 5 MG/5ML PO SOLN: 5.0000 mg | Freq: Once | ORAL | Status: DC | PRN

## 2020-09-23 MED ORDER — TRANEXAMIC ACID-NACL 1000-0.7 MG/100ML-% IV SOLN
INTRAVENOUS | Status: AC
  Filled 2020-09-23: qty 100

## 2020-09-23 MED ORDER — POTASSIUM CHLORIDE 10 MEQ/100ML IV SOLN
INTRAVENOUS | Status: AC
  Filled 2020-09-23: qty 100

## 2020-09-23 MED ORDER — POLYETHYLENE GLYCOL 3350 17 G PO PACK: 17.0000 g | PACK | Freq: Every day | ORAL | Status: DC | PRN

## 2020-09-23 MED ORDER — TRANEXAMIC ACID-NACL 1000-0.7 MG/100ML-% IV SOLN
INTRAVENOUS | Status: DC | PRN
  Administered 2020-09-23: 1000 mg via INTRAVENOUS

## 2020-09-23 MED ORDER — CEFAZOLIN SODIUM-DEXTROSE 2-4 GM/100ML-% IV SOLN
2.0000 g | INTRAVENOUS | Status: DC
  Filled 2020-09-23: qty 100

## 2020-09-23 MED ORDER — CEFAZOLIN SODIUM-DEXTROSE 2-4 GM/100ML-% IV SOLN
INTRAVENOUS | Status: AC
  Filled 2020-09-23: qty 100

## 2020-09-23 MED ORDER — OXYCODONE HCL 5 MG PO TABS: 5.0000 mg | ORAL_TABLET | ORAL | Status: DC | PRN

## 2020-09-23 MED ORDER — METHOCARBAMOL 1000 MG/10ML IJ SOLN
1000.0000 mg | Freq: Three times a day (TID) | INTRAVENOUS | Status: DC
  Administered 2020-09-23: 1000 mg via INTRAVENOUS
  Filled 2020-09-23: qty 10

## 2020-09-23 MED ORDER — SUCCINYLCHOLINE CHLORIDE 20 MG/ML IJ SOLN
INTRAMUSCULAR | Status: DC | PRN
  Administered 2020-09-23: 120 mg via INTRAVENOUS

## 2020-09-23 MED ORDER — ONDANSETRON HCL 4 MG/2ML IJ SOLN: 4.0000 mg | Freq: Four times a day (QID) | INTRAMUSCULAR | Status: DC | PRN

## 2020-09-23 MED ORDER — VANCOMYCIN HCL 1 G IV SOLR
INTRAVENOUS | Status: DC | PRN
  Administered 2020-09-23: 1000 mg via TOPICAL

## 2020-09-23 MED ORDER — KCL IN DEXTROSE-NACL 20-5-0.45 MEQ/L-%-% IV SOLN
INTRAVENOUS | Status: DC
  Filled 2020-09-23 (×5): qty 1000

## 2020-09-23 MED ORDER — POVIDONE-IODINE 10 % EX SWAB: 2.0000 "application " | Freq: Once | CUTANEOUS | Status: DC

## 2020-09-23 MED ORDER — FENTANYL CITRATE (PF) 250 MCG/5ML IJ SOLN
INTRAMUSCULAR | Status: AC
  Filled 2020-09-23: qty 5

## 2020-09-23 MED ORDER — FENTANYL CITRATE (PF) 100 MCG/2ML IJ SOLN
INTRAMUSCULAR | Status: DC | PRN
  Administered 2020-09-23: 50 ug via INTRAVENOUS
  Administered 2020-09-23: 150 ug via INTRAVENOUS
  Administered 2020-09-23 (×2): 50 ug via INTRAVENOUS

## 2020-09-23 MED ORDER — ALBUMIN HUMAN 5 % IV SOLN: INTRAVENOUS | Status: DC | PRN

## 2020-09-23 MED ORDER — LIDOCAINE-EPINEPHRINE 1 %-1:100000 IJ SOLN
INTRAMUSCULAR | Status: AC
  Administered 2020-09-23: 1 mL
  Filled 2020-09-23: qty 1

## 2020-09-23 MED ORDER — ENOXAPARIN SODIUM 40 MG/0.4ML ~~LOC~~ SOLN
40.0000 mg | SUBCUTANEOUS | Status: DC
  Administered 2020-09-24 – 2020-09-26 (×3): 40 mg via SUBCUTANEOUS
  Filled 2020-09-23 (×3): qty 0.4

## 2020-09-23 MED ORDER — CHLORHEXIDINE GLUCONATE 4 % EX LIQD: 60.0000 mL | Freq: Once | CUTANEOUS | Status: DC

## 2020-09-23 MED ORDER — OXYCODONE HCL 5 MG PO TABS
10.0000 mg | ORAL_TABLET | ORAL | Status: DC | PRN
  Administered 2020-09-23: 15 mg via ORAL
  Administered 2020-09-24: 10 mg via ORAL
  Filled 2020-09-23: qty 2
  Filled 2020-09-23: qty 3
  Filled 2020-09-23: qty 2
  Filled 2020-09-23: qty 3
  Filled 2020-09-23: qty 2

## 2020-09-23 MED ORDER — ONDANSETRON 4 MG PO TBDP: 4.0000 mg | ORAL_TABLET | Freq: Four times a day (QID) | ORAL | Status: DC | PRN

## 2020-09-23 MED ORDER — ONDANSETRON HCL 4 MG PO TABS: 4.0000 mg | ORAL_TABLET | Freq: Four times a day (QID) | ORAL | Status: DC | PRN

## 2020-09-23 MED ORDER — TETANUS-DIPHTH-ACELL PERTUSSIS 5-2.5-18.5 LF-MCG/0.5 IM SUSP
0.5000 mL | Freq: Once | INTRAMUSCULAR | Status: AC
  Administered 2020-09-23: 0.5 mL via INTRAMUSCULAR
  Filled 2020-09-23: qty 0.5

## 2020-09-23 MED ORDER — HYDROMORPHONE HCL 1 MG/ML IJ SOLN
INTRAMUSCULAR | Status: AC
  Filled 2020-09-23: qty 1

## 2020-09-23 MED ORDER — LIDOCAINE 2% (20 MG/ML) 5 ML SYRINGE
INTRAMUSCULAR | Status: DC | PRN
  Administered 2020-09-23: 100 mg via INTRAVENOUS

## 2020-09-23 MED ORDER — MIDAZOLAM HCL 2 MG/2ML IJ SOLN
INTRAMUSCULAR | Status: AC
  Filled 2020-09-23: qty 2

## 2020-09-23 MED ORDER — METOPROLOL TARTRATE 5 MG/5ML IV SOLN: 5.0000 mg | Freq: Four times a day (QID) | INTRAVENOUS | Status: DC | PRN

## 2020-09-23 MED ORDER — ONDANSETRON HCL 4 MG/2ML IJ SOLN
INTRAMUSCULAR | Status: DC | PRN
  Administered 2020-09-23: 4 mg via INTRAVENOUS

## 2020-09-23 MED ORDER — MORPHINE SULFATE (PF) 4 MG/ML IV SOLN
4.0000 mg | Freq: Once | INTRAVENOUS | Status: AC
  Administered 2020-09-23: 4 mg via INTRAVENOUS
  Filled 2020-09-23: qty 1

## 2020-09-23 MED ORDER — SUGAMMADEX SODIUM 200 MG/2ML IV SOLN
INTRAVENOUS | Status: DC | PRN
  Administered 2020-09-23: 200 mg via INTRAVENOUS

## 2020-09-23 MED ORDER — DOCUSATE SODIUM 100 MG PO CAPS
100.0000 mg | ORAL_CAPSULE | Freq: Two times a day (BID) | ORAL | Status: DC
  Administered 2020-09-23 – 2020-09-26 (×6): 100 mg via ORAL
  Filled 2020-09-23 (×6): qty 1

## 2020-09-23 MED ORDER — ONDANSETRON HCL 4 MG/2ML IJ SOLN
4.0000 mg | Freq: Once | INTRAMUSCULAR | Status: AC
  Administered 2020-09-23: 4 mg via INTRAVENOUS
  Filled 2020-09-23: qty 2

## 2020-09-23 MED ORDER — HYDROMORPHONE HCL 1 MG/ML IJ SOLN
INTRAMUSCULAR | Status: AC
Start: 2020-09-23 — End: 2020-09-24
  Filled 2020-09-23: qty 1

## 2020-09-23 MED ORDER — IOHEXOL 300 MG/ML  SOLN
100.0000 mL | Freq: Once | INTRAMUSCULAR | Status: AC | PRN
  Administered 2020-09-23: 100 mL via INTRAVENOUS

## 2020-09-23 MED ORDER — OXYCODONE HCL 5 MG PO TABS: 5.0000 mg | ORAL_TABLET | Freq: Once | ORAL | Status: DC | PRN

## 2020-09-23 MED ORDER — MORPHINE SULFATE (PF) 4 MG/ML IV SOLN: 4.0000 mg | INTRAVENOUS | Status: DC | PRN

## 2020-09-23 MED ORDER — CEFAZOLIN SODIUM-DEXTROSE 2-4 GM/100ML-% IV SOLN
2.0000 g | Freq: Three times a day (TID) | INTRAVENOUS | Status: DC
  Administered 2020-09-23: 2 g via INTRAVENOUS

## 2020-09-23 MED ORDER — POTASSIUM CHLORIDE 10 MEQ/100ML IV SOLN
10.0000 meq | INTRAVENOUS | Status: AC
  Administered 2020-09-23 (×2): 10 meq via INTRAVENOUS

## 2020-09-23 MED ORDER — MIDAZOLAM HCL 5 MG/5ML IJ SOLN
INTRAMUSCULAR | Status: DC | PRN
  Administered 2020-09-23: 2 mg via INTRAVENOUS

## 2020-09-23 MED ORDER — METHOCARBAMOL 1000 MG/10ML IJ SOLN
1000.0000 mg | Freq: Three times a day (TID) | INTRAVENOUS | Status: DC | PRN
  Filled 2020-09-23: qty 10

## 2020-09-23 MED ORDER — HYDROMORPHONE HCL 1 MG/ML IJ SOLN
0.5000 mg | INTRAMUSCULAR | Status: DC | PRN
  Administered 2020-09-23 (×2): 1 mg via INTRAVENOUS
  Filled 2020-09-23: qty 1

## 2020-09-23 MED ORDER — ONDANSETRON HCL 4 MG/2ML IJ SOLN: 4.0000 mg | Freq: Once | INTRAMUSCULAR | Status: DC

## 2020-09-23 MED ORDER — DOCUSATE SODIUM 100 MG PO CAPS: 100.0000 mg | ORAL_CAPSULE | Freq: Two times a day (BID) | ORAL | Status: DC

## 2020-09-23 MED ORDER — 0.9 % SODIUM CHLORIDE (POUR BTL) OPTIME
TOPICAL | Status: DC | PRN
  Administered 2020-09-23: 1000 mL

## 2020-09-23 MED ORDER — CHLORHEXIDINE GLUCONATE CLOTH 2 % EX PADS
6.0000 | MEDICATED_PAD | Freq: Every day | CUTANEOUS | Status: DC
  Administered 2020-09-24 – 2020-09-26 (×3): 6 via TOPICAL

## 2020-09-23 MED ORDER — METOCLOPRAMIDE HCL 5 MG/ML IJ SOLN
5.0000 mg | Freq: Three times a day (TID) | INTRAMUSCULAR | Status: DC | PRN
  Administered 2020-09-24: 10 mg via INTRAVENOUS
  Filled 2020-09-23: qty 2

## 2020-09-23 MED ORDER — MORPHINE SULFATE (PF) 2 MG/ML IV SOLN: 2.0000 mg | INTRAVENOUS | Status: DC | PRN

## 2020-09-23 MED ORDER — VANCOMYCIN HCL 1000 MG IV SOLR
INTRAVENOUS | Status: AC
  Filled 2020-09-23: qty 1000

## 2020-09-23 MED ORDER — PHENYLEPHRINE HCL-NACL 10-0.9 MG/250ML-% IV SOLN
INTRAVENOUS | Status: DC | PRN
  Administered 2020-09-23: 50 ug/min via INTRAVENOUS

## 2020-09-23 MED ORDER — HYDROMORPHONE HCL 1 MG/ML IJ SOLN
0.2500 mg | INTRAMUSCULAR | Status: DC | PRN
  Administered 2020-09-23: 0.5 mg via INTRAVENOUS

## 2020-09-23 MED ORDER — PROPOFOL 10 MG/ML IV BOLUS
INTRAVENOUS | Status: DC | PRN
  Administered 2020-09-23: 140 mg via INTRAVENOUS

## 2020-09-23 MED ORDER — HYDROMORPHONE HCL 1 MG/ML IJ SOLN
1.0000 mg | Freq: Once | INTRAMUSCULAR | Status: AC
  Administered 2020-09-23: 1 mg via INTRAVENOUS
  Filled 2020-09-23: qty 1

## 2020-09-23 SURGICAL SUPPLY — 76 items
BIT DRILL 2.5X300 (BIT) ×2 IMPLANT
BIT DRILL QC 3.5X195 (BIT) ×2 IMPLANT
BIT DRILL STEP 3.5 (DRILL) IMPLANT
BLADE CLIPPER SURG (BLADE) IMPLANT
BRUSH SCRUB EZ PLAIN DRY (MISCELLANEOUS) ×2 IMPLANT
CHLORAPREP W/TINT 26 (MISCELLANEOUS) ×2 IMPLANT
CLEANER TIP ELECTROSURG 2X2 (MISCELLANEOUS) ×2 IMPLANT
COVER SURGICAL LIGHT HANDLE (MISCELLANEOUS) ×2 IMPLANT
COVER WAND RF STERILE (DRAPES) IMPLANT
DERMABOND ADVANCED (GAUZE/BANDAGES/DRESSINGS) ×2
DERMABOND ADVANCED .7 DNX12 (GAUZE/BANDAGES/DRESSINGS) ×2 IMPLANT
DRAPE C-ARM 42X72 X-RAY (DRAPES) ×2 IMPLANT
DRAPE C-ARMOR (DRAPES) ×2 IMPLANT
DRAPE INCISE IOBAN 66X45 STRL (DRAPES) ×4 IMPLANT
DRAPE INCISE IOBAN 85X60 (DRAPES) ×2 IMPLANT
DRAPE ORTHO SPLIT 77X108 STRL (DRAPES) ×2
DRAPE SURG ORHT 6 SPLT 77X108 (DRAPES) ×2 IMPLANT
DRAPE U-SHAPE 47X51 STRL (DRAPES) ×2 IMPLANT
DRILL BIT 2.5X300 (BIT) ×2
DRILL STEP 3.5 (DRILL)
DRSG MEPILEX BORDER 4X12 (GAUZE/BANDAGES/DRESSINGS) ×2 IMPLANT
DRSG MEPILEX BORDER 4X8 (GAUZE/BANDAGES/DRESSINGS) IMPLANT
ELECT BLADE 6.5 EXT (BLADE) ×2 IMPLANT
ELECT REM PT RETURN 9FT ADLT (ELECTROSURGICAL) ×2
ELECTRODE REM PT RTRN 9FT ADLT (ELECTROSURGICAL) ×1 IMPLANT
GLOVE BIO SURGEON STRL SZ 6.5 (GLOVE) ×6 IMPLANT
GLOVE BIO SURGEON STRL SZ7.5 (GLOVE) ×8 IMPLANT
GLOVE BIOGEL PI IND STRL 6.5 (GLOVE) ×1 IMPLANT
GLOVE BIOGEL PI IND STRL 7.5 (GLOVE) ×1 IMPLANT
GLOVE BIOGEL PI INDICATOR 6.5 (GLOVE) ×1
GLOVE BIOGEL PI INDICATOR 7.5 (GLOVE) ×1
GOWN STRL REUS W/ TWL LRG LVL3 (GOWN DISPOSABLE) ×2 IMPLANT
GOWN STRL REUS W/TWL LRG LVL3 (GOWN DISPOSABLE) ×2
HANDPIECE INTERPULSE COAX TIP (DISPOSABLE) ×1
KIT BASIN OR (CUSTOM PROCEDURE TRAY) ×2 IMPLANT
KIT TURNOVER KIT B (KITS) ×2 IMPLANT
MANIFOLD NEPTUNE II (INSTRUMENTS) ×2 IMPLANT
NS IRRIG 1000ML POUR BTL (IV SOLUTION) ×2 IMPLANT
PACK TOTAL JOINT (CUSTOM PROCEDURE TRAY) ×2 IMPLANT
PAD ARMBOARD 7.5X6 YLW CONV (MISCELLANEOUS) ×4 IMPLANT
PLATE BONE 91MM 7HOLE PELVIC (Plate) ×2 IMPLANT
PLATE SPRING 2H 3.5MM PELVIC (Plate) ×2 IMPLANT
RETRIEVER SUT HEWSON (MISCELLANEOUS) ×2 IMPLANT
SCREW CORTEX 3.5 34MM (Screw) ×1 IMPLANT
SCREW CORTEX 3.5 36MM (Screw) ×2 IMPLANT
SCREW CORTEX 3.5X40MM (Screw) ×4 IMPLANT
SCREW LOCK CORT ST 3.5X34 (Screw) ×1 IMPLANT
SCREW LOCK CORT ST 3.5X36 (Screw) ×2 IMPLANT
SCREW PELVIC CORT ST 3.5X60 (Screw) ×1 IMPLANT
SCREW SCHNZ BLNT TRCR PT 5X200 (Screw) ×2 IMPLANT
SCREW SELF TAP 3.5 60M (Screw) ×1 IMPLANT
SCRW SCHANZ BLNT TRCR PT 5X200 (Screw) ×4 IMPLANT
SET HNDPC FAN SPRY TIP SCT (DISPOSABLE) ×1 IMPLANT
SPONGE LAP 18X18 RF (DISPOSABLE) ×4 IMPLANT
STAPLER VISISTAT 35W (STAPLE) ×2 IMPLANT
SUCTION FRAZIER HANDLE 10FR (MISCELLANEOUS) ×1
SUCTION TUBE FRAZIER 10FR DISP (MISCELLANEOUS) ×1 IMPLANT
SUT ETHILON 2 0 PSLX (SUTURE) IMPLANT
SUT FIBERWIRE #2 38 REV NDL BL (SUTURE) ×4
SUT FIBERWIRE #2 38 T-5 BLUE (SUTURE)
SUT MNCRL AB 3-0 PS2 18 (SUTURE) ×2 IMPLANT
SUT MON AB 2-0 CT1 36 (SUTURE) IMPLANT
SUT VIC AB 0 CT1 27 (SUTURE)
SUT VIC AB 0 CT1 27XBRD ANBCTR (SUTURE) IMPLANT
SUT VIC AB 1 CT1 18XCR BRD 8 (SUTURE) IMPLANT
SUT VIC AB 1 CT1 27 (SUTURE) ×2
SUT VIC AB 1 CT1 27XBRD ANBCTR (SUTURE) ×2 IMPLANT
SUT VIC AB 1 CT1 8-18 (SUTURE)
SUT VIC AB 2-0 CT1 27 (SUTURE) ×3
SUT VIC AB 2-0 CT1 TAPERPNT 27 (SUTURE) ×3 IMPLANT
SUTURE FIBERWR #2 38 T-5 BLUE (SUTURE) IMPLANT
SUTURE FIBERWR#2 38 REV NDL BL (SUTURE) ×2 IMPLANT
TOWEL GREEN STERILE (TOWEL DISPOSABLE) ×4 IMPLANT
TOWEL GREEN STERILE FF (TOWEL DISPOSABLE) ×4 IMPLANT
TRAY FOLEY MTR SLVR 16FR STAT (SET/KITS/TRAYS/PACK) ×2 IMPLANT
WATER STERILE IRR 1000ML POUR (IV SOLUTION) IMPLANT

## 2020-09-23 NOTE — Progress Notes (Signed)
Pt requested calls to be made to his grandfather and girlfriend. After several attempts, I rec'd messages that calls could not be completed as dialed. Pt is currently in CT scan. Will attempt to get correct numbers. Please pg if further assistance is needed. Chaplain Elmarie Shiley, MDiv   09/23/20 0600  Clinical Encounter Type  Visited With Patient
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
## 2020-09-23 NOTE — Anesthesia Preprocedure Evaluation (Addendum)
Anesthesia Evaluation  Patient identified by MRN, date of birth, ID band Patient awake    Reviewed: Allergy & Precautions, NPO status , Patient's Chart, lab work & pertinent test results  History of Anesthesia Complications Negative for: history of anesthetic complications  Airway Mallampati: II  TM Distance: >3 FB Neck ROM: Full    Dental no notable dental hx. (+) Dental Advisory Given   Pulmonary neg pulmonary ROS,    Pulmonary exam normal        Cardiovascular negative cardio ROS Normal cardiovascular exam     Neuro/Psych negative neurological ROS     GI/Hepatic negative GI ROS, Neg liver ROS,   Endo/Other  negative endocrine ROS  Renal/GU negative Renal ROS     Musculoskeletal negative musculoskeletal ROS (+)   Abdominal   Peds  Hematology negative hematology ROS (+)   Anesthesia Other Findings   Reproductive/Obstetrics                            Anesthesia Physical Anesthesia Plan  ASA: III and emergent  Anesthesia Plan: General   Post-op Pain Management:    Induction: Intravenous  PONV Risk Score and Plan: Ondansetron and Dexamethasone  Airway Management Planned: Oral ETT  Additional Equipment:   Intra-op Plan:   Post-operative Plan: Extubation in OR  Informed Consent: I have reviewed the patients History and Physical, chart, labs and discussed the procedure including the risks, benefits and alternatives for the proposed anesthesia with the patient or authorized representative who has indicated his/her understanding and acceptance.     Dental advisory given  Plan Discussed with: Anesthesiologist and CRNA  Anesthesia Plan Comments:        Anesthesia Quick Evaluation
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
## 2020-09-23 NOTE — Transfer of Care (Signed)
Immediate Anesthesia Transfer of Care Note  Patient: Quintel Mccalla  Procedure(s) Performed: OPEN REDUCTION INTERNAL FIXATION RIGHT ACETABULUM POSTERIOR LATERAL (Right Pelvis)  Patient Location: PACU  Anesthesia Type:General  Level of Consciousness: drowsy and patient cooperative  Airway & Oxygen Therapy: Patient Spontanous Breathing  Post-op Assessment: Report given to RN and Post -op Vital signs reviewed and stable  Post vital signs: Reviewed and stable  Last Vitals:  Vitals Value Taken Time  BP 168/81 09/23/20 1332  Temp    Pulse 82 09/23/20 1333  Resp 27 09/23/20 1333  SpO2 98 % 09/23/20 1333  Vitals shown include unvalidated device data.  Last Pain:  Vitals:   09/23/20 0634  PainSc: 10-Worst pain ever         Complications: No complications documented.
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
## 2020-09-23 NOTE — ED Provider Notes (Signed)
  Physical Exam  BP (!) 173/75   Pulse 68   Temp (!) 96.6 F (35.9 C)   Resp (!) 28   Ht 5\' 11"  (1.803 m)   Wt 88.5 kg   SpO2 99%   BMI 27.20 kg/m   Physical Exam HENT:     Head:     Comments: R cheek with stellate laceration. Chin with V shaped deep lac, hemostatic. Upper lip with macerated lac through vermilion border.    ED Course/Procedures     . Laceration Repair  Date/Time: 09/23/2020 9:21 AM Performed by: 09/25/2020, MD Authorized by: Loletha Carrow, MD   Consent:    Consent obtained:  Emergent situation Laceration details:    Location: R cheek, upper lip, chin. Repair type:    Repair type:  Complex Pre-procedure details:    Preparation:  Patient was prepped and draped in usual sterile fashion and imaging obtained to evaluate for foreign bodies Treatment:    Area cleansed with:  Saline   Amount of cleaning:  Extensive   Irrigation solution:  Sterile saline   Irrigation method:  Syringe Skin repair:    Repair method:  Sutures   Suture size:  6-0   Suture material:  Prolene   Number of sutures: 1  6-0 Prolene simple interrupted to R cheek, 2 sutures 5-0 fast gut deep dermal to chin lac, 5 sutures 6-0  simple interrupted to chin lac,  1 suture 6-0 simple interrupted to vermilion border of upper lip. Approximation:    Approximation:  Close Post-procedure details:    Patient tolerance of procedure:  Tolerated well, no immediate complications    MDM  Lacs repaired as documented above. Due to orthopedic team being ready in OR, patient rapidly transported with surgical team reporting that they would continue any further required lac repair.    The care of this patient was overseen by Dr. Roslynn Amble, ED attending, who agreed with my assessment and evaluation.         Madilyn Hook, MD 09/23/20 1002    09/25/20, MD 09/23/20 1343

## 2020-09-23 NOTE — Anesthesia Procedure Notes (Signed)
Procedure Name: Intubation Date/Time: 09/23/2020 10:34 AM Performed by: Lavell Luster, CRNA Pre-anesthesia Checklist: Patient identified, Emergency Drugs available, Suction available, Patient being monitored and Timeout performed Patient Re-evaluated:Patient Re-evaluated prior to induction Oxygen Delivery Method: Circle system utilized Preoxygenation: Pre-oxygenation with 100% oxygen Induction Type: IV induction, Cricoid Pressure applied and Rapid sequence Laryngoscope Size: Mac and 4 Grade View: Grade I Tube type: Oral Tube size: 7.5 mm Number of attempts: 1 Airway Equipment and Method: Stylet Placement Confirmation: ETT inserted through vocal cords under direct vision,  positive ETCO2 and breath sounds checked- equal and bilateral Secured at: 22 cm Tube secured with: Tape Dental Injury: Teeth and Oropharynx as per pre-operative assessment
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
## 2020-09-23 NOTE — ED Notes (Signed)
C collar removed by ED residents

## 2020-09-24 ENCOUNTER — Inpatient Hospital Stay (HOSPITAL_COMMUNITY): Payer: Self-pay

## 2020-09-24 ENCOUNTER — Encounter (HOSPITAL_COMMUNITY): Payer: Self-pay

## 2020-09-24 ENCOUNTER — Other Ambulatory Visit: Payer: Self-pay

## 2020-09-24 DIAGNOSIS — S32401A Unspecified fracture of right acetabulum, initial encounter for closed fracture: Secondary | ICD-10-CM | POA: Diagnosis present

## 2020-09-24 LAB — CBC
HCT: 36.7 % — ABNORMAL LOW (ref 39.0–52.0)
Hemoglobin: 12.1 g/dL — ABNORMAL LOW (ref 13.0–17.0)
MCH: 27.4 pg (ref 26.0–34.0)
MCHC: 33 g/dL (ref 30.0–36.0)
MCV: 83 fL (ref 80.0–100.0)
Platelets: 164 10*3/uL (ref 150–400)
RBC: 4.42 MIL/uL (ref 4.22–5.81)
RDW: 13.8 % (ref 11.5–15.5)
WBC: 12.6 10*3/uL — ABNORMAL HIGH (ref 4.0–10.5)
nRBC: 0 % (ref 0.0–0.2)

## 2020-09-24 LAB — URINALYSIS, ROUTINE W REFLEX MICROSCOPIC
Bilirubin Urine: NEGATIVE
Glucose, UA: NEGATIVE mg/dL
Hgb urine dipstick: NEGATIVE
Ketones, ur: NEGATIVE mg/dL
Leukocytes,Ua: NEGATIVE
Nitrite: NEGATIVE
Protein, ur: NEGATIVE mg/dL
Specific Gravity, Urine: 1.008 (ref 1.005–1.030)
pH: 6 (ref 5.0–8.0)

## 2020-09-24 LAB — BASIC METABOLIC PANEL
Anion gap: 8 (ref 5–15)
BUN: 5 mg/dL — ABNORMAL LOW (ref 6–20)
CO2: 25 mmol/L (ref 22–32)
Calcium: 8.5 mg/dL — ABNORMAL LOW (ref 8.9–10.3)
Chloride: 103 mmol/L (ref 98–111)
Creatinine, Ser: 0.7 mg/dL (ref 0.61–1.24)
GFR, Estimated: >60 mL/min (ref >60)
Glucose, Bld: 126 mg/dL — ABNORMAL HIGH (ref 70–99)
Potassium: 4 mmol/L (ref 3.5–5.1)
Sodium: 136 mmol/L (ref 135–145)

## 2020-09-24 LAB — VITAMIN D 25 HYDROXY (VIT D DEFICIENCY, FRACTURES): Vit D, 25-Hydroxy: 10.92 ng/mL — ABNORMAL LOW (ref 30–100)

## 2020-09-24 LAB — HIV ANTIBODY (ROUTINE TESTING W REFLEX): HIV Screen 4th Generation wRfx: NONREACTIVE

## 2020-09-24 MED ORDER — VITAMIN D 25 MCG (1000 UNIT) PO TABS
2000.0000 [IU] | ORAL_TABLET | Freq: Two times a day (BID) | ORAL | Status: DC
  Administered 2020-09-24 – 2020-09-26 (×5): 2000 [IU] via ORAL
  Filled 2020-09-24 (×5): qty 2

## 2020-09-24 MED ORDER — BACITRACIN ZINC 500 UNIT/GM EX OINT
TOPICAL_OINTMENT | Freq: Two times a day (BID) | CUTANEOUS | Status: DC
  Filled 2020-09-24 (×2): qty 28.4
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
## 2020-09-24 NOTE — Plan of Care (Signed)
  Problem: Clinical Measurements: Goal: Ability to maintain clinical measurements within normal limits will improve Outcome: Progressing Goal: Postoperative complications will be avoided or minimized Outcome: Progressing   Problem: Skin Integrity: Goal: Demonstration of wound healing without infection will improve Outcome: Progressing   
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
## 2020-09-24 NOTE — Anesthesia Postprocedure Evaluation (Signed)
Anesthesia Post Note  Patient: Curtis Paul  Procedure(s) Performed: OPEN REDUCTION INTERNAL FIXATION RIGHT ACETABULUM POSTERIOR LATERAL (Right Pelvis)     Patient location during evaluation: PACU Anesthesia Type: General Level of consciousness: awake and alert Pain management: pain level controlled Vital Signs Assessment: post-procedure vital signs reviewed and stable Respiratory status: spontaneous breathing, nonlabored ventilation, respiratory function stable and patient connected to nasal cannula oxygen Cardiovascular status: blood pressure returned to baseline and stable Postop Assessment: no apparent nausea or vomiting Anesthetic complications: no   No complications documented.  Last Vitals:  Vitals:   09/24/20 0302 09/24/20 0733  BP: 134/78 108/77  Pulse: 81 78  Resp: 12 20  Temp: 36.9 C 36.8 C  SpO2: 97% 95%    Last Pain:  Vitals:   09/24/20 0733  TempSrc: Oral  PainSc:                  Sonnie Bias S
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
## 2020-09-24 NOTE — TOC CAGE-AID Note (Signed)
Transition of Care [facility]) - CAGE-AID Screening   Patient Details  Name: Curtis Paul MRN: [id number] Date of Birth: 1996-01-31  [facility]) CM/SW Contact:    Emeterio Reeve, [place] Phone Number: 09/24/2020, 4:29 PM   Clinical Narrative: CSW met with pt at bedside. CSW introduced self and explained her role at the hospital.  Pt reports occasional alcohol use and occasional marijuana use. Pt denies any other substances. Pt repots he does not need resources at this time.   CAGE-AID Screening:    Have You Ever Felt You Ought to Cut Down on Your Drinking or Drug Use?: No Have People Annoyed You By Critizing Your Drinking Or Drug Use?: No Have You Felt Bad Or Guilty About Your Drinking Or Drug Use?: No Have You Ever Had a Drink or Used Drugs First Thing In The Morning to Steady Your Nerves or to Get Rid of a Hangover?: No CAGE-AID Score: 0  Substance Abuse Education Offered: Yes    Blima Ledger, [place] Social Worker [phone]

## 2020-09-24 NOTE — Plan of Care (Signed)
  Problem: Skin Integrity: Goal: Demonstration of wound healing without infection will improve Outcome: Progressing   
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
## 2020-09-25 LAB — BASIC METABOLIC PANEL
Anion gap: 8 (ref 5–15)
BUN: <5 mg/dL — ABNORMAL LOW (ref 6–20)
CO2: 25 mmol/L (ref 22–32)
Calcium: 8.6 mg/dL — ABNORMAL LOW (ref 8.9–10.3)
Chloride: 103 mmol/L (ref 98–111)
Creatinine, Ser: 0.64 mg/dL (ref 0.61–1.24)
GFR, Estimated: >60 mL/min (ref >60)
Glucose, Bld: 117 mg/dL — ABNORMAL HIGH (ref 70–99)
Potassium: 4.3 mmol/L (ref 3.5–5.1)
Sodium: 136 mmol/L (ref 135–145)

## 2020-09-25 LAB — CBC
HCT: 34.7 % — ABNORMAL LOW (ref 39.0–52.0)
Hemoglobin: 11.8 g/dL — ABNORMAL LOW (ref 13.0–17.0)
MCH: 27.6 pg (ref 26.0–34.0)
MCHC: 34 g/dL (ref 30.0–36.0)
MCV: 81.3 fL (ref 80.0–100.0)
Platelets: 127 10*3/uL — ABNORMAL LOW (ref 150–400)
RBC: 4.27 MIL/uL (ref 4.22–5.81)
RDW: 13.6 % (ref 11.5–15.5)
WBC: 14.6 10*3/uL — ABNORMAL HIGH (ref 4.0–10.5)
nRBC: 0 % (ref 0.0–0.2)

## 2020-09-25 MED ORDER — HYDROMORPHONE HCL 1 MG/ML IJ SOLN
0.5000 mg | INTRAMUSCULAR | Status: DC | PRN
  Administered 2020-09-25 (×2): 1 mg via INTRAVENOUS
  Filled 2020-09-25 (×2): qty 1

## 2020-09-25 MED ORDER — OXYCODONE HCL 5 MG PO TABS
5.0000 mg | ORAL_TABLET | ORAL | Status: DC | PRN
  Administered 2020-09-25: 10 mg via ORAL
  Administered 2020-09-25: 5 mg via ORAL
  Administered 2020-09-26: 10 mg via ORAL
  Filled 2020-09-25 (×2): qty 2
  Filled 2020-09-25: qty 1

## 2020-09-25 MED ORDER — POLYETHYLENE GLYCOL 3350 17 G PO PACK
17.0000 g | PACK | Freq: Every day | ORAL | Status: DC
  Administered 2020-09-25 – 2020-09-26 (×2): 17 g via ORAL
  Filled 2020-09-25 (×2): qty 1

## 2020-09-25 NOTE — Progress Notes (Signed)
Pt transfer to 5N  RM  31 Report given to [facility] receiving RN.
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
## 2020-09-25 NOTE — Progress Notes (Signed)
° °  ORTHOPAEDIC PROGRESS NOTE  s/p Procedure(s): OPEN REDUCTION INTERNAL FIXATION RIGHT ACETABULUM POSTERIOR LATERAL  SUBJECTIVE: Reports mild pain about operative site. No chest pain. No SOB. No nausea/vomiting. No other complaints.  OBJECTIVE: PE:  Vitals:   09/25/20 0305 09/25/20 0803  BP: (!) 145/79 (!) 141/77  Pulse: 82 88  Resp: 17 20  Temp: 97.9 F (36.6 C) 99.8 F (37.7 C)  SpO2: 100% 100%     ASSESSMENT: Curtis Paul is a 24 y.o. male doing well postoperatively.  PLAN: Weightbearing: TDWB RLE Insicional and dressing care: dressing is clean dry and intact  Orthopedic device(s): None Showering:not yet  VTE prophylaxis: Lovenox 40mg  qd  Pain control: controlled  Follow - up plan: ok for discharge when cleared by trauma and PT Contact information:    Patient ID: Curtis Paul, male   DOB: 09-06-1996, 24 y.o.   MRN: [id number]

## 2020-09-25 NOTE — Plan of Care (Signed)
  Problem: Education: Goal: Required Educational Video(s) Outcome: Progressing   Problem: Clinical Measurements: Goal: Ability to maintain clinical measurements within normal limits will improve Outcome: Progressing Goal: Postoperative complications will be avoided or minimized Outcome: Progressing   Problem: Skin Integrity: Goal: Demonstration of wound healing without infection will improve Outcome: Progressing   

## 2020-09-25 NOTE — Plan of Care (Signed)
  Problem: Skin Integrity: Goal: Demonstration of wound healing without infection will improve Outcome: Progressing   Problem: Clinical Measurements: Goal: Postoperative complications will be avoided or minimized Outcome: Progressing   Problem: Clinical Measurements: Goal: Ability to maintain clinical measurements within normal limits will improve Outcome: Progressing   Problem: Clinical Measurements: Goal: Ability to maintain clinical measurements within normal limits will improve Outcome: Progressing
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
## 2020-09-26 LAB — BASIC METABOLIC PANEL
Anion gap: 8 (ref 5–15)
BUN: 6 mg/dL (ref 6–20)
CO2: 27 mmol/L (ref 22–32)
Calcium: 8.7 mg/dL — ABNORMAL LOW (ref 8.9–10.3)
Chloride: 101 mmol/L (ref 98–111)
Creatinine, Ser: 0.71 mg/dL (ref 0.61–1.24)
GFR, Estimated: >60 mL/min (ref >60)
Glucose, Bld: 108 mg/dL — ABNORMAL HIGH (ref 70–99)
Potassium: 4.1 mmol/L (ref 3.5–5.1)
Sodium: 136 mmol/L (ref 135–145)

## 2020-09-26 LAB — CBC
HCT: 35 % — ABNORMAL LOW (ref 39.0–52.0)
Hemoglobin: 11.6 g/dL — ABNORMAL LOW (ref 13.0–17.0)
MCH: 28 pg (ref 26.0–34.0)
MCHC: 33.1 g/dL (ref 30.0–36.0)
MCV: 84.3 fL (ref 80.0–100.0)
Platelets: 164 10*3/uL (ref 150–400)
RBC: 4.15 MIL/uL — ABNORMAL LOW (ref 4.22–5.81)
RDW: 13.6 % (ref 11.5–15.5)
WBC: 12.4 10*3/uL — ABNORMAL HIGH (ref 4.0–10.5)
nRBC: 0 % (ref 0.0–0.2)

## 2020-09-26 MED ORDER — DOCUSATE SODIUM 100 MG PO CAPS: 100.0000 mg | ORAL_CAPSULE | Freq: Two times a day (BID) | ORAL | 0 refills | Status: AC

## 2020-09-26 MED ORDER — VITAMIN D3 25 MCG PO TABS: 2000.0000 [IU] | ORAL_TABLET | Freq: Two times a day (BID) | ORAL | Status: AC

## 2020-09-26 MED ORDER — OXYCODONE HCL 5 MG PO TABS
5.0000 mg | ORAL_TABLET | ORAL | 0 refills | Status: AC | PRN
Start: 2020-09-26 — End: ?

## 2020-09-26 MED ORDER — POLYETHYLENE GLYCOL 3350 17 G PO PACK: 17.0000 g | PACK | Freq: Every day | ORAL | 0 refills | Status: AC

## 2020-09-26 MED ORDER — METHOCARBAMOL 500 MG PO TABS: 1000.0000 mg | ORAL_TABLET | Freq: Three times a day (TID) | ORAL | 1 refills | Status: AC | PRN

## 2020-09-26 MED ORDER — ACETAMINOPHEN 500 MG PO TABS: 1000.0000 mg | ORAL_TABLET | Freq: Four times a day (QID) | ORAL | 0 refills | Status: AC | PRN

## 2020-09-26 MED ORDER — BACITRACIN ZINC 500 UNIT/GM EX OINT: TOPICAL_OINTMENT | Freq: Two times a day (BID) | CUTANEOUS | 0 refills | Status: AC
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
## 2020-09-26 NOTE — Progress Notes (Signed)
Patient ID: Tu Bayle, male   DOB: [birth year], 24 y.o.   MRN: [id number]   ORTHOPAEDIC PROGRESS NOTE  s/p Procedure(s): OPEN REDUCTION INTERNAL FIXATION RIGHT ACETABULUM POSTERIOR LATERAL  SUBJECTIVE: Reports mild pain about operative site. No chest pain. No SOB. No nausea/vomiting. No other complaints.  OBJECTIVE: PE:  Vitals:   09/26/20 0256 09/26/20 0805  BP: 126/89 (!) 141/86  Pulse: 81 82  Resp: 18 20  Temp: 98.8 F (37.1 C) 99.3 F (37.4 C)  SpO2: 98% 100%     ASSESSMENT: Keighan Amezcua is a 24 y.o. male doing well postoperatively.  PLAN: Weightbearing: TDWB RLE Insicional and dressing care: new dressing today before discharge Orthopedic device(s): none VTE prophylaxis: Lovenox 40mg  qd 30 days Pain control: well controlled  Follow - up plan: 2 weeks Haddix Contact information:
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
## 2020-09-26 NOTE — Progress Notes (Signed)
Pt is ready for discharge. Discharge instructions and AVS given to patient. Pt vocalizes understanding of discharge education. IV x2 removed, pt family at bedside for transport home. Vitals are stable and the patient has no complaints or further questions at this time.

## 2020-09-26 NOTE — Plan of Care (Signed)
  Problem: Education: Goal: Required Educational Video(s) Outcome: Progressing   Problem: Clinical Measurements: Goal: Ability to maintain clinical measurements within normal limits will improve Outcome: Progressing Goal: Postoperative complications will be avoided or minimized Outcome: Progressing   

## 2020-09-26 NOTE — TOC Transition Note (Addendum)
Transition of Care [facility]) - CM/SW Discharge Note   Patient Details  Name: Curtis Paul MRN: [id number] Date of Birth: 12-Jul-1996  [facility]) CM/SW Contact:  Elliot Cousin, RN Phone Number: [phone] 09/26/2020, 10:13 AM   Clinical Narrative:    TOC CM spoke to pt and gave permission to speak to girlfriend. Pt wanted price for RW and 3n1 bedside commode. RW is $41 and BSC $39 out of pocket. Contacted [employer] for delivery of DME to room and pt wanting to pay out of pocket.   Pt will be use his grandmother's RW and bedside commode.      Barriers to Discharge: No Barriers Identified   Patient Goals and CMS Choice   CMS Medicare.gov Compare Post Acute Care list provided to:: Patient Choice offered to / list presented to : Patient  Discharge Placement                       Discharge Plan and Services   Discharge Planning Services: CM Consult Post Acute Care Choice: Home Health, Durable Medical Equipment          DME Arranged: 3-N-1, Walker rolling DME Agency: [employer] Date DME Agency Contacted: 09/26/20 Time DME Agency Contacted: 1012 Representative spoke with at DME Agency: [employer] Rep            Social Determinants of Health (SDOH) Interventions     Readmission Risk Interventions No flowsheet data found.

## 2020-09-26 NOTE — Discharge Instructions (Signed)
Weightbearing: Touchdown Weightbaring right lower extremity Insicional and dressing care: dressing is clean dry and intact

## 2020-09-27 ENCOUNTER — Encounter (HOSPITAL_BASED_OUTPATIENT_CLINIC_OR_DEPARTMENT_OTHER): Payer: Self-pay

## 2020-09-27 ENCOUNTER — Encounter (HOSPITAL_COMMUNITY): Payer: Self-pay | Admitting: Student

## 2021-05-13 ENCOUNTER — Emergency Department (HOSPITAL_BASED_OUTPATIENT_CLINIC_OR_DEPARTMENT_OTHER): Payer: Worker's Compensation

## 2021-05-13 ENCOUNTER — Other Ambulatory Visit: Payer: Self-pay

## 2021-05-13 ENCOUNTER — Encounter (HOSPITAL_BASED_OUTPATIENT_CLINIC_OR_DEPARTMENT_OTHER): Payer: Self-pay | Admitting: *Deleted

## 2021-05-13 ENCOUNTER — Emergency Department (HOSPITAL_BASED_OUTPATIENT_CLINIC_OR_DEPARTMENT_OTHER)
Admission: EM | Admit: 2021-05-13 | Discharge: 2021-05-13 | Disposition: A | Discharge status: Home / Self Care | Payer: Worker's Compensation | Attending: Emergency Medicine | Admitting: Emergency Medicine

## 2021-05-13 DIAGNOSIS — Y9339 Activity, other involving climbing, rappelling and jumping off: Secondary | ICD-10-CM | POA: Diagnosis not present

## 2021-05-13 DIAGNOSIS — S7001XA Contusion of right hip, initial encounter: Secondary | ICD-10-CM | POA: Insufficient documentation

## 2021-05-13 DIAGNOSIS — Y990 Civilian activity done for income or pay: Secondary | ICD-10-CM | POA: Diagnosis not present

## 2021-05-13 DIAGNOSIS — W19XXXA Unspecified fall, initial encounter: Secondary | ICD-10-CM

## 2021-05-13 DIAGNOSIS — Y9289 Other specified places as the place of occurrence of the external cause: Secondary | ICD-10-CM | POA: Insufficient documentation

## 2021-05-13 DIAGNOSIS — J45909 Unspecified asthma, uncomplicated: Secondary | ICD-10-CM | POA: Insufficient documentation

## 2021-05-13 DIAGNOSIS — Y30XXXA Falling, jumping or pushed from a high place, undetermined intent, initial encounter: Secondary | ICD-10-CM | POA: Insufficient documentation

## 2021-05-13 DIAGNOSIS — S79911A Unspecified injury of right hip, initial encounter: Secondary | ICD-10-CM | POA: Diagnosis present
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
## 2021-05-13 NOTE — Discharge Instructions (Addendum)
You were seen in the emergency department for right hip pain after a fall.  Your x-rays did not show any obvious fracture.  Please use ice to the affected area and take ibuprofen for pain.  Follow-up with your orthopedic doctor.  Return to the emergency department if any worsening or concerning symptoms
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
## 2021-07-17 IMAGING — CR DG ABDOMEN ACUTE W/ 1V CHEST
3 series · 3 of 3 positions shown · non-contrast
Comparison: Chest x-ray 01/12/2020.  Lumbar spine 11/03/2013.

CLINICAL DATA: Left flank pain.

EXAM:
DG ABDOMEN ACUTE W/ 1V CHEST

[w chest pa]
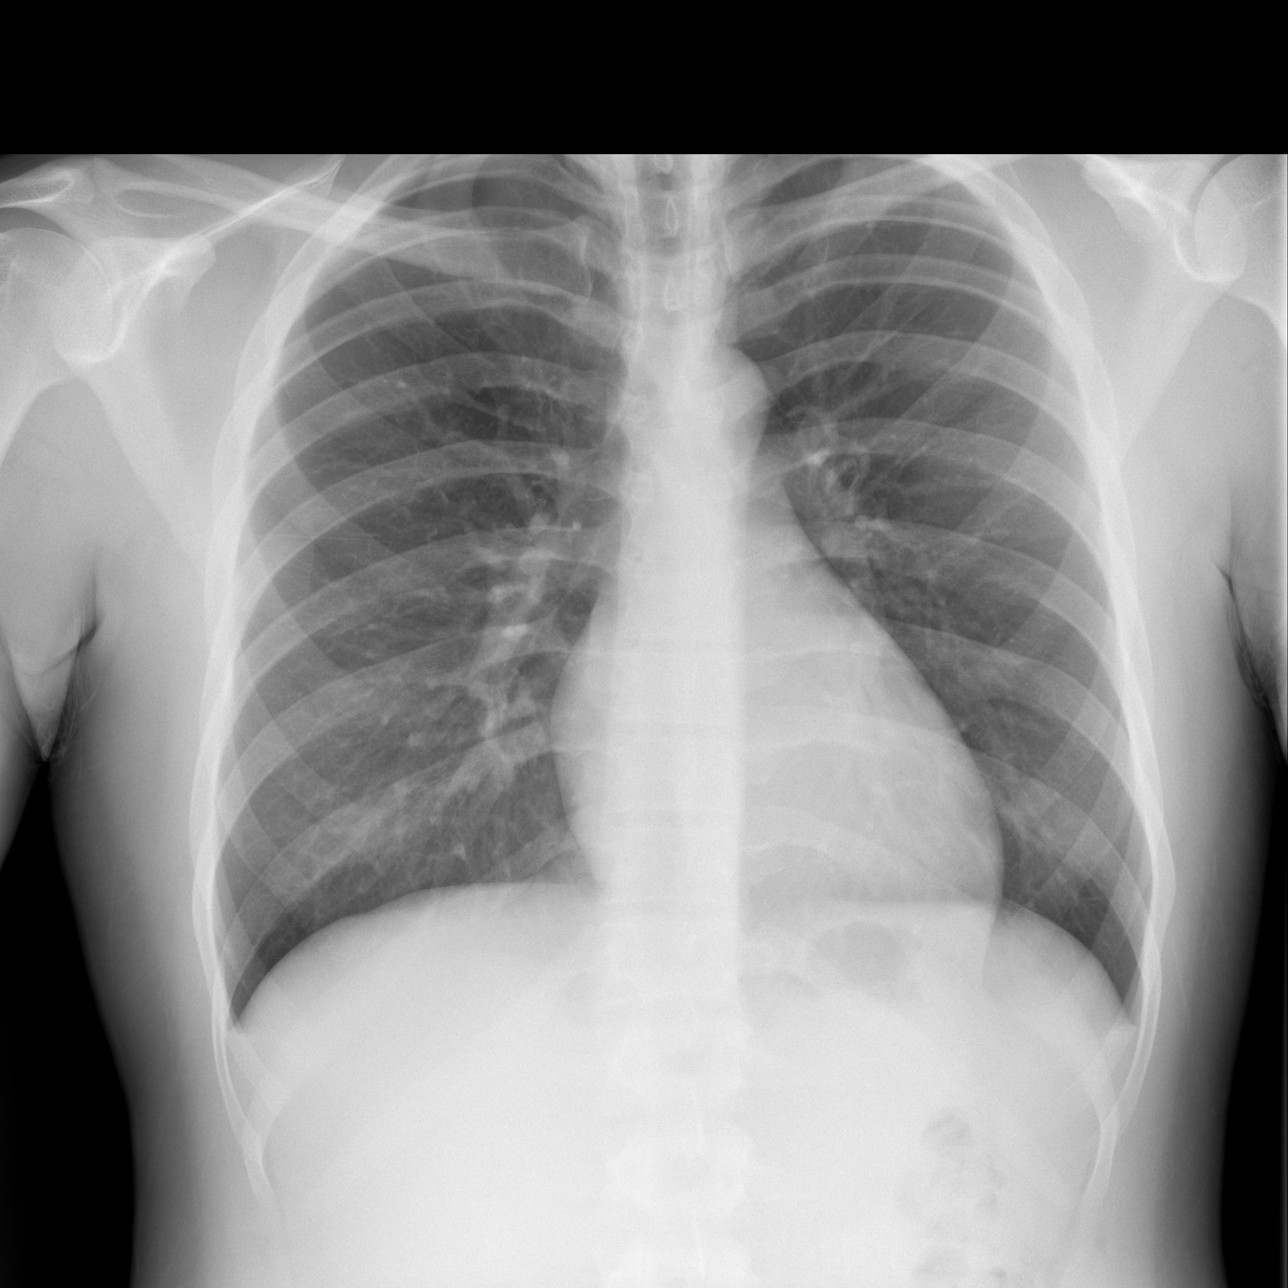

[w abdomen upright]
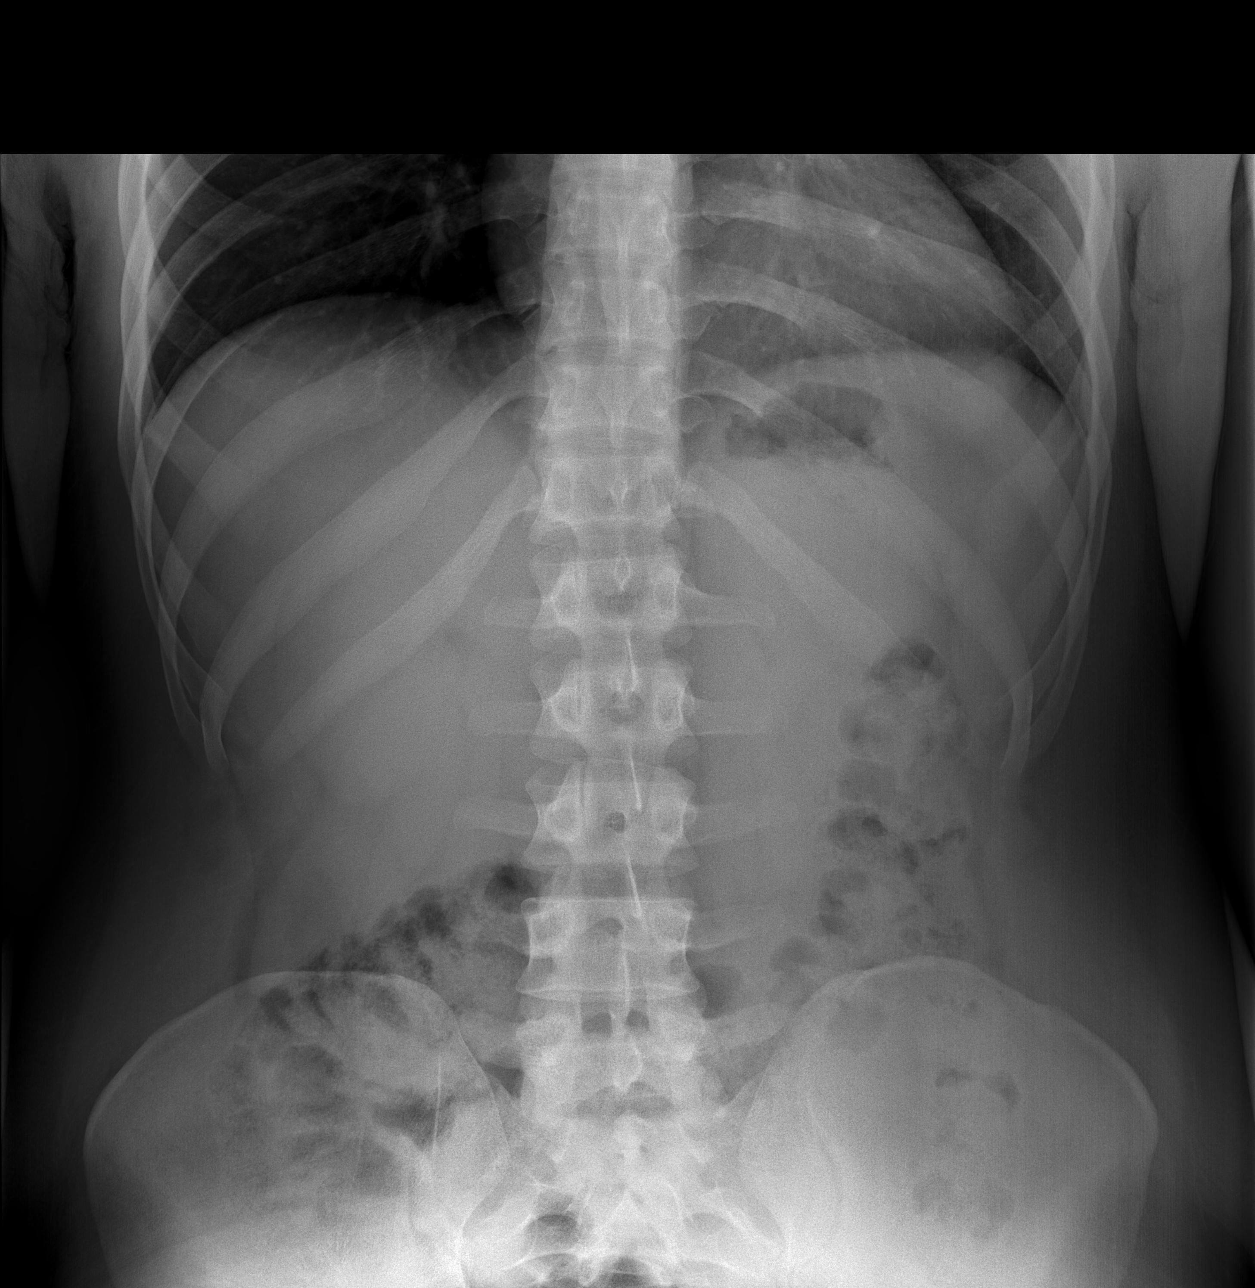

[t abdomen supine]
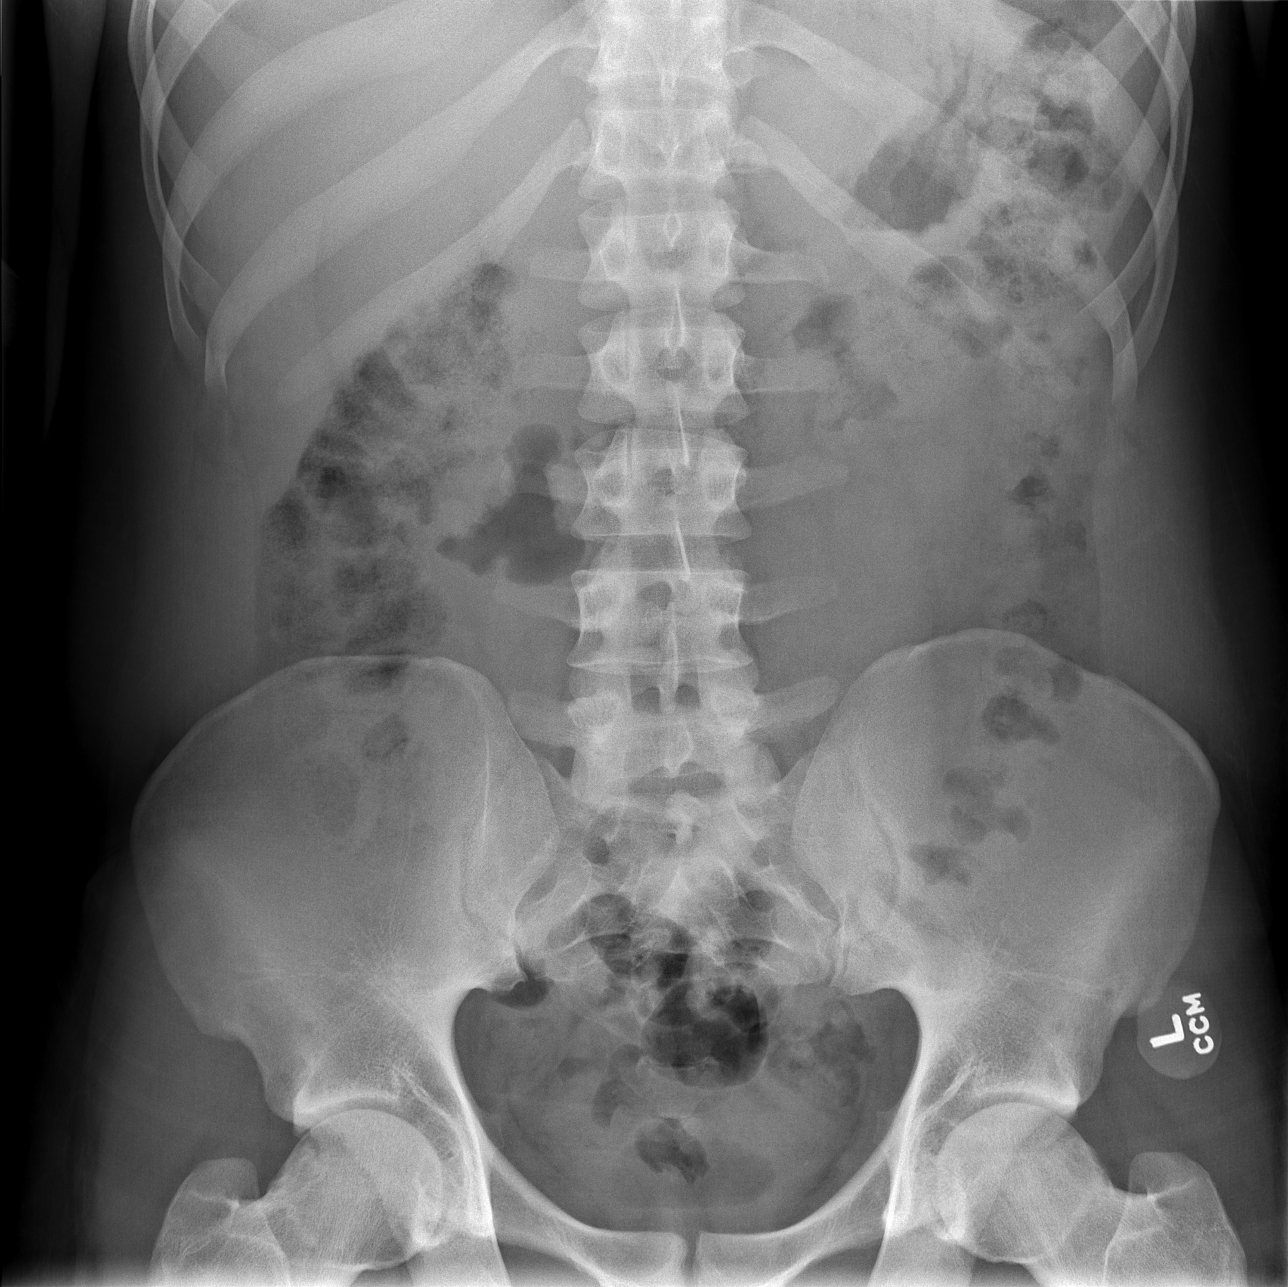

[3 of 3 positions shown; findings below may reference images not displayed]

FINDINGS: No acute cardiopulmonary disease. Soft tissues of the abdomen are
unremarkable. Stool noted throughout the colon. This makes
evaluation for renal stone disease difficult. No bowel distention or
free air. Tiny punctate calcifications in pelvis most likely
phleboliths.
IMPRESSION: No acute cardiopulmonary disease. No acute intra-abdominal
abnormality identified.

## 2021-09-19 ENCOUNTER — Encounter (HOSPITAL_BASED_OUTPATIENT_CLINIC_OR_DEPARTMENT_OTHER): Payer: Self-pay

## 2021-09-19 ENCOUNTER — Emergency Department (HOSPITAL_BASED_OUTPATIENT_CLINIC_OR_DEPARTMENT_OTHER): Payer: Self-pay

## 2021-09-19 ENCOUNTER — Emergency Department (HOSPITAL_BASED_OUTPATIENT_CLINIC_OR_DEPARTMENT_OTHER)
Admission: EM | Admit: 2021-09-19 | Discharge: 2021-09-19 | Disposition: A | Discharge status: Home / Self Care | Payer: Self-pay | Attending: Emergency Medicine | Admitting: Emergency Medicine

## 2021-09-19 DIAGNOSIS — J45909 Unspecified asthma, uncomplicated: Secondary | ICD-10-CM | POA: Insufficient documentation

## 2021-09-19 DIAGNOSIS — R0789 Other chest pain: Secondary | ICD-10-CM | POA: Insufficient documentation

## 2021-09-19 LAB — BASIC METABOLIC PANEL
Anion gap: 6 (ref 5–15)
BUN: 11 mg/dL (ref 6–20)
CO2: 27 mmol/L (ref 22–32)
Calcium: 9.2 mg/dL (ref 8.9–10.3)
Chloride: 102 mmol/L (ref 98–111)
Creatinine, Ser: 0.86 mg/dL (ref 0.61–1.24)
GFR, Estimated: >60 mL/min (ref >60)
Glucose, Bld: 108 mg/dL — ABNORMAL HIGH (ref 70–99)
Potassium: 4.3 mmol/L (ref 3.5–5.1)
Sodium: 135 mmol/L (ref 135–145)

## 2021-09-19 LAB — CBC
HCT: 42.1 % (ref 39.0–52.0)
Hemoglobin: 14.3 g/dL (ref 13.0–17.0)
MCH: 27.4 pg (ref 26.0–34.0)
MCHC: 34 g/dL (ref 30.0–36.0)
MCV: 80.8 fL (ref 80.0–100.0)
Platelets: 206 10*3/uL (ref 150–400)
RBC: 5.21 MIL/uL (ref 4.22–5.81)
RDW: 12.9 % (ref 11.5–15.5)
WBC: 7.1 10*3/uL (ref 4.0–10.5)
nRBC: 0 % (ref 0.0–0.2)

## 2021-09-19 LAB — TROPONIN I (HIGH SENSITIVITY): Troponin I (High Sensitivity): <2 ng/L (ref <18)

## 2021-09-19 NOTE — ED Triage Notes (Signed)
Pt c/o chest pain & shortness of breath after hitting vape. Hx of asthma. NAD during triage.

## 2021-09-19 NOTE — Discharge Instructions (Addendum)
Stop vaping. Follow up with a primary care provider. Return to the ER for worsening or concerning symptoms.
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
## 2021-09-19 NOTE — ED Notes (Signed)
Pt states pain started today while Vaping, does smoke THC as well but last use was 1 week ago.  States no pain at this time

## 2022-01-07 ENCOUNTER — Encounter (HOSPITAL_BASED_OUTPATIENT_CLINIC_OR_DEPARTMENT_OTHER): Payer: Self-pay | Admitting: Emergency Medicine

## 2022-01-07 ENCOUNTER — Emergency Department (HOSPITAL_BASED_OUTPATIENT_CLINIC_OR_DEPARTMENT_OTHER): Payer: Self-pay

## 2022-01-07 ENCOUNTER — Emergency Department (HOSPITAL_BASED_OUTPATIENT_CLINIC_OR_DEPARTMENT_OTHER)
Admission: EM | Admit: 2022-01-07 | Discharge: 2022-01-07 | Disposition: A | Discharge status: Home / Self Care | Payer: Self-pay | Attending: Emergency Medicine | Admitting: Emergency Medicine

## 2022-01-07 DIAGNOSIS — M25551 Pain in right hip: Secondary | ICD-10-CM | POA: Insufficient documentation

## 2022-01-07 DIAGNOSIS — W19XXXA Unspecified fall, initial encounter: Secondary | ICD-10-CM

## 2022-01-07 DIAGNOSIS — W010XXA Fall on same level from slipping, tripping and stumbling without subsequent striking against object, initial encounter: Secondary | ICD-10-CM | POA: Insufficient documentation

## 2022-01-07 DIAGNOSIS — Y990 Civilian activity done for income or pay: Secondary | ICD-10-CM | POA: Insufficient documentation

## 2022-01-07 NOTE — ED Notes (Signed)
No acute distress noted upon this RN's departure of patient. Verified discharge paperwork with name and DOB. Vital signs stable. Patient taken to checkout window. Discharge paperwork discussed with patient. Patient ambulated without assistance or difficulty.  No further questions voiced upon discharge.

## 2022-01-07 NOTE — ED Provider Notes (Signed)
[facility] EMERGENCY DEPARTMENT Provider Note   CSN: [id number] Arrival date & time: 01/07/22  1420     History  Chief Complaint  Patient presents with   Curtis Paul    [name] is a 26 y.o. male. With past medical history of ORIF right acetabulum in 2021 who presents to the emergency department after fall.  Patient states that today he was stepping up into his work [employer] when he slipped and fell. States that he fell onto his right hip which he previously had surgery on two years ago. He has ongoing aching pain to the right hip. States pain is worse with sitting. Wanted to ensure that hip was okay. Denies other injuries. Ambulatory since event.    Fall      Home Medications Prior to Admission medications   Medication Sig Start Date End Date Taking? Authorizing Provider  acetaminophen (TYLENOL) 500 MG tablet Take 2 tablets (1,000 mg total) by mouth every 6 (six) hours as needed. 09/26/20   Barnetta Chapelsborne, Kelly, PA-C  bacitracin ointment Apply topically 2 (two) times daily. 09/26/20   Barnetta Chapelsborne, Kelly, PA-C  cholecalciferol (VITAMIN D) 25 MCG tablet Take 2 tablets (2,000 Units total) by mouth 2 (two) times daily. 09/26/20   Barnetta Chapelsborne, Kelly, PA-C  diphenhydrAMINE (BENADRYL) 25 MG tablet Take 1 tablet (25 mg total) by mouth every 6 (six) hours as needed. 01/19/20   Horton, Mayer Maskerourtney F, MD  docusate sodium (COLACE) 100 MG capsule Take 1 capsule (100 mg total) by mouth 2 (two) times daily. 09/26/20   Barnetta Chapelsborne, Kelly, PA-C  EPINEPHrine 0.3 mg/0.3 mL IJ SOAJ injection Inject 0.3 mLs (0.3 mg total) into the muscle as needed for anaphylaxis. 01/19/20   Horton, Mayer Maskerourtney F, MD  famotidine (PEPCID) 20 MG tablet Take 1 tablet (20 mg total) by mouth daily. 01/19/20   Horton, Mayer Maskerourtney F, MD  ibuprofen (ADVIL) 600 MG tablet Take 1 tablet (600 mg total) by mouth every 6 (six) hours as needed. 03/26/20   Dartha LodgeFord, Kelsey N, PA-C  methocarbamol (ROBAXIN) 500 MG tablet Take 1 tablet (500 mg total) by mouth 2  (two) times daily. 03/26/20   Dartha LodgeFord, Kelsey N, PA-C  methocarbamol (ROBAXIN) 500 MG tablet Take 2 tablets (1,000 mg total) by mouth every 8 (eight) hours as needed for muscle spasms. 09/26/20   Barnetta Chapelsborne, Kelly, PA-C  oxyCODONE (OXY IR/ROXICODONE) 5 MG immediate release tablet Take 1-2 tablets (5-10 mg total) by mouth every 4 (four) hours as needed. 09/26/20   Barnetta Chapelsborne, Kelly, PA-C  polyethylene glycol (MIRALAX / GLYCOLAX) 17 g packet Take 17 g by mouth daily. 09/26/20   Barnetta Chapelsborne, Kelly, PA-C  albuterol (PROVENTIL HFA;VENTOLIN HFA) 108 (90 Base) MCG/ACT inhaler Inhale 1-2 puffs into the lungs every 6 (six) hours as needed for wheezing or shortness of breath. 12/18/18 12/12/19  Petrucelli, Samantha R, PA-C      Allergies    Shellfish allergy and Shellfish allergy    Review of Systems   Review of Systems  Musculoskeletal:  Positive for arthralgias.  All other systems reviewed and are negative.  Physical Exam Updated Vital Signs BP (!) 143/85    Pulse 78    Temp 98.1 F (36.7 C) (Oral)    Resp 18    Ht 5\' 11"  (1.803 m)    Wt 79.8 kg    SpO2 99%    BMI 24.55 kg/m  Physical Exam Vitals and nursing note reviewed.  Constitutional:      General: He is not in acute distress.  Appearance: Normal appearance. He is not ill-appearing or toxic-appearing.  HENT:     Head: Normocephalic and atraumatic.  Eyes:     General: No scleral icterus.    Extraocular Movements: Extraocular movements intact.  Cardiovascular:     Pulses: Normal pulses.  Pulmonary:     Effort: Pulmonary effort is normal. No respiratory distress.  Musculoskeletal:        General: Tenderness present. No swelling, deformity or signs of injury. Normal range of motion.     Cervical back: Neck supple.     Right hip: Tenderness present. No deformity. Normal range of motion. Normal strength.     Right lower leg: No edema.       Legs:  Skin:    General: Skin is warm and dry.     Capillary Refill: Capillary refill takes less than 2  seconds.     Findings: No bruising or rash.  Neurological:     General: No focal deficit present.     Mental Status: He is alert and oriented to person, place, and time. Mental status is at baseline.     Sensory: No sensory deficit.     Motor: No weakness.     Gait: Gait normal.  Psychiatric:        Mood and Affect: Mood normal.        Behavior: Behavior normal.        Thought Content: Thought content normal.        Judgment: Judgment normal.    ED Results / Procedures / Treatments   Labs (all labs ordered are listed, but only abnormal results are displayed) Labs Reviewed - No data to display  EKG None  Radiology DG Hip Unilat With Pelvis 2-3 Views Right  Result Date: 01/07/2022 CLINICAL DATA:  Fall, injury to RIGHT lateral hip. Pain. History of previous surgery. EXAM: DG HIP (WITH OR WITHOUT PELVIS) 2-3V RIGHT COMPARISON:  Plain film of the pelvis and RIGHT hip dated 05/13/2021. FINDINGS: Osseous alignment is stable. No evidence of acute fracture. The fixation hardware across the RIGHT acetabulum two inferior pubic ramus appears intact and stable in alignment. Soft tissues about the pelvis and RIGHT hip are unremarkable. IMPRESSION: No acute findings. No evidence of acute osseous fracture. Fixation hardware at the RIGHT hip appears intact and stable in alignment. Electronically Signed   By: Bary Richard M.D.   On: 01/07/2022 16:14    Procedures Procedures    Medications Ordered in ED Medications - No data to display  ED Course/ Medical Decision Making/ A&P                           Medical Decision Making Amount and/or Complexity of Data Reviewed Radiology: ordered.  Patient presents to the ED with complaints of fall. This involves an extensive number of treatment options, and is a complaint that carries with it a moderate risk of complications and morbidity.   Additional history obtained:  Additional history obtained from: n/a External records from outside source  obtained and reviewed including: Previous hip surgery notes  Imaging Studies ordered:  I ordered imaging studies which included x-ray.  I independently reviewed & interpreted imaging & am in agreement with radiology impression. Imaging shows: No acute findings.  No fractures.  Fixation hardware intact and stable.  ED Course: 25 year old male who presents to emergency department after fall.  He has previous ORIF of the right acetabulum and was concerned that he disrupted the  hardware or reinjured his hip.  No other injuries noted.  He has range of motion.  He is somewhat increased pain with internal rotation of the right hip.  No deformities noted.  Strength intact.  Neurovascularly intact.  X-ray of the right hip with no acute findings.  Discussed results with patient.  Discussed use of Tylenol and Motrin as needed for pain.  Return precautions for inability to ambulate.  He verbalized understanding.  After consideration of the diagnostic results and the patients response to treatment, I feel that the patent would benefit from discharge. The patient has been appropriately medically screened and/or stabilized in the ED. I have low suspicion for any other emergent medical condition which would require further screening, evaluation or treatment in the ED or require inpatient management. The patient is overall well appearing and non-toxic in appearance. They are hemodynamically stable at time of discharge.   Final Clinical Impression(s) / ED Diagnoses Final diagnoses:  Fall, initial encounter    Rx / DC Orders ED Discharge Orders     None         Cristopher Peru, PA-C 01/07/22 1631    Long, Arlyss Repress, MD 01/17/22 [phone]

## 2022-01-07 NOTE — ED Triage Notes (Signed)
Pt arrives pov, endorses fall after missing step while working, c/I right side hip pain. Pt reports hip surgery x 2 yrs pta. Pt denies head injury

## 2022-01-07 NOTE — Discharge Instructions (Signed)
You are seen in the emergency department today after a fall onto your right hip.  While you are here we did an x-ray which showed there were no fractures.  The hardware on your hip looks in place and normal.  Please use Tylenol and Motrin as needed for pain relief.  Please return if you are unable to walk.

## 2022-07-24 ENCOUNTER — Emergency Department (HOSPITAL_BASED_OUTPATIENT_CLINIC_OR_DEPARTMENT_OTHER): Payer: Self-pay

## 2022-07-24 ENCOUNTER — Other Ambulatory Visit: Payer: Self-pay

## 2022-07-24 ENCOUNTER — Emergency Department (HOSPITAL_BASED_OUTPATIENT_CLINIC_OR_DEPARTMENT_OTHER)
Admission: EM | Admit: 2022-07-24 | Discharge: 2022-07-24 | Disposition: A | Discharge status: Home / Self Care | Payer: Self-pay | Attending: Emergency Medicine | Admitting: Emergency Medicine

## 2022-07-24 ENCOUNTER — Encounter (HOSPITAL_BASED_OUTPATIENT_CLINIC_OR_DEPARTMENT_OTHER): Payer: Self-pay | Admitting: Emergency Medicine

## 2022-07-24 DIAGNOSIS — R0789 Other chest pain: Secondary | ICD-10-CM | POA: Insufficient documentation

## 2022-07-24 DIAGNOSIS — J45909 Unspecified asthma, uncomplicated: Secondary | ICD-10-CM | POA: Insufficient documentation

## 2022-07-24 LAB — CBC
HCT: 41.9 % (ref 39.0–52.0)
Hemoglobin: 14.1 g/dL (ref 13.0–17.0)
MCH: 27.9 pg (ref 26.0–34.0)
MCHC: 33.7 g/dL (ref 30.0–36.0)
MCV: 83 fL (ref 80.0–100.0)
Platelets: 173 10*3/uL (ref 150–400)
RBC: 5.05 MIL/uL (ref 4.22–5.81)
RDW: 13.7 % (ref 11.5–15.5)
WBC: 8.9 10*3/uL (ref 4.0–10.5)
nRBC: 0 % (ref 0.0–0.2)

## 2022-07-24 LAB — BASIC METABOLIC PANEL
Anion gap: 10 (ref 5–15)
BUN: 6 mg/dL (ref 6–20)
CO2: 23 mmol/L (ref 22–32)
Calcium: 9 mg/dL (ref 8.9–10.3)
Chloride: 105 mmol/L (ref 98–111)
Creatinine, Ser: 0.71 mg/dL (ref 0.61–1.24)
GFR, Estimated: >60 mL/min (ref >60)
Glucose, Bld: 101 mg/dL — ABNORMAL HIGH (ref 70–99)
Potassium: 3.5 mmol/L (ref 3.5–5.1)
Sodium: 138 mmol/L (ref 135–145)

## 2022-07-24 LAB — TROPONIN I (HIGH SENSITIVITY): Troponin I (High Sensitivity): 3 ng/L (ref <18)

## 2022-07-24 NOTE — ED Provider Notes (Signed)
[facility] EMERGENCY DEPARTMENT Provider Note   CSN: [id number] Arrival date & time: 07/24/22  1856     History  Chief Complaint  Patient presents with   Chest Pain    Curtis Paul is a 26 y.o. male.  The history is provided by the patient and medical records.  Chest Pain Curtis Paul is a 26 y.o. male who presents to the Emergency Department complaining of chest pain.  He presents to the emergency department for evaluation of chest pain that started last night while he was moving a couch.  Pain is central and left-sided and described as sharp in nature.  He only feels the pain when he is moving it his pain resolved at the time of triage.  No associated fever, shortness of breath, nausea, vomiting, numbness, weakness.  He does vape and has a history of asthma.  Occasional alcohol use.  No drug use.  No hx/o dvt/pe, cad.    Family hx/o dm.       Home Medications Prior to Admission medications   Medication Sig Start Date End Date Taking? Authorizing Provider  acetaminophen (TYLENOL) 500 MG tablet Take 2 tablets (1,000 mg total) by mouth every 6 (six) hours as needed. 09/26/20   Barnetta Chapel, PA-C  bacitracin ointment Apply topically 2 (two) times daily. 09/26/20   Barnetta Chapel, PA-C  cholecalciferol (VITAMIN D) 25 MCG tablet Take 2 tablets (2,000 Units total) by mouth 2 (two) times daily. 09/26/20   Barnetta Chapel, PA-C  diphenhydrAMINE (BENADRYL) 25 MG tablet Take 1 tablet (25 mg total) by mouth every 6 (six) hours as needed. 01/19/20   Horton, Mayer Masker, MD  docusate sodium (COLACE) 100 MG capsule Take 1 capsule (100 mg total) by mouth 2 (two) times daily. 09/26/20   Barnetta Chapel, PA-C  EPINEPHrine 0.3 mg/0.3 mL IJ SOAJ injection Inject 0.3 mLs (0.3 mg total) into the muscle as needed for anaphylaxis. 01/19/20   Horton, Mayer Masker, MD  famotidine (PEPCID) 20 MG tablet Take 1 tablet (20 mg total) by mouth daily. 01/19/20   Horton, Mayer Masker, MD  ibuprofen  (ADVIL) 600 MG tablet Take 1 tablet (600 mg total) by mouth every 6 (six) hours as needed. 03/26/20   Dartha Lodge, PA-C  methocarbamol (ROBAXIN) 500 MG tablet Take 1 tablet (500 mg total) by mouth 2 (two) times daily. 03/26/20   Dartha Lodge, PA-C  methocarbamol (ROBAXIN) 500 MG tablet Take 2 tablets (1,000 mg total) by mouth every 8 (eight) hours as needed for muscle spasms. 09/26/20   Barnetta Chapel, PA-C  oxyCODONE (OXY IR/ROXICODONE) 5 MG immediate release tablet Take 1-2 tablets (5-10 mg total) by mouth every 4 (four) hours as needed. 09/26/20   Barnetta Chapel, PA-C  polyethylene glycol (MIRALAX / GLYCOLAX) 17 g packet Take 17 g by mouth daily. 09/26/20   Barnetta Chapel, PA-C  albuterol (PROVENTIL HFA;VENTOLIN HFA) 108 (90 Base) MCG/ACT inhaler Inhale 1-2 puffs into the lungs every 6 (six) hours as needed for wheezing or shortness of breath. 12/18/18 12/12/19  Petrucelli, Samantha R, PA-C      Allergies    Shellfish allergy and Shellfish allergy    Review of Systems   Review of Systems  Cardiovascular:  Positive for chest pain.  All other systems reviewed and are negative.   Physical Exam Updated Vital Signs BP (!) 143/90 (BP Location: Right Arm)   Pulse (!) 55   Temp 98.1 F (36.7 C) (Oral)   Resp 14  Ht 5\' 11"  (1.803 m)   Wt 80.3 kg   SpO2 100%   BMI 24.69 kg/m  Physical Exam Vitals and nursing note reviewed.  Constitutional:      Appearance: He is well-developed.  HENT:     Head: Normocephalic and atraumatic.  Cardiovascular:     Rate and Rhythm: Normal rate and regular rhythm.     Heart sounds: No murmur heard. Pulmonary:     Effort: Pulmonary effort is normal. No respiratory distress.     Breath sounds: Normal breath sounds.  Chest:     Chest wall: No tenderness.  Abdominal:     Palpations: Abdomen is soft.     Tenderness: There is no abdominal tenderness. There is no guarding or rebound.  Musculoskeletal:        General: No swelling or tenderness.  Skin:     General: Skin is warm and dry.  Neurological:     Mental Status: He is alert and oriented to person, place, and time.  Psychiatric:        Behavior: Behavior normal.     ED Results / Procedures / Treatments   Labs (all labs ordered are listed, but only abnormal results are displayed) Labs Reviewed  BASIC METABOLIC PANEL - Abnormal; Notable for the following components:      Result Value   Glucose, Bld 101 (*)    All other components within normal limits  CBC  TROPONIN I (HIGH SENSITIVITY)  TROPONIN I (HIGH SENSITIVITY)    EKG EKG Interpretation  Date/Time:  Monday July 24 2022 19:11:37 EDT Ventricular Rate:  62 PR Interval:  140 QRS Duration: 88 QT Interval:  376 QTC Calculation: 381 R Axis:   85 Text Interpretation: Normal sinus rhythm Cannot rule out Anterior infarct , age undetermined Abnormal ECG No significant change since last tracing Confirmed by 01[birth year]29[birth year]1993 [phone]) on 07/24/2022 10:58:08 PM  Radiology DG Chest 2 View  Result Date: 07/24/2022 CLINICAL DATA:  Chest pain. EXAM: CHEST - 2 VIEW COMPARISON:  Chest radiograph dated 09/19/2021. FINDINGS: The heart size and mediastinal contours are within normal limits. Both lungs are clear. The visualized skeletal structures are unremarkable. IMPRESSION: No active cardiopulmonary disease. Electronically Signed   By: 09/21/2021 M.D.   On: 07/24/2022 19:21    Procedures Procedures    Medications Ordered in ED Medications - No data to display  ED Course/ Medical Decision Making/ A&P                           Medical Decision Making Amount and/or Complexity of Data Reviewed Labs: ordered. Radiology: ordered.   Patient here for evaluation of central left-sided chest pain that started 24 hours ago.  Pain was reproducible prior to ED evaluation and is now currently resolved.  Doubt PE, PERC negative.  Presentation is not consistent with ACS, negative troponin, EKG is unremarkable and symptoms are atypical.   Presentation is not consistent with dissection, pneumonia.  No evidence of pneumothorax or rib fracture.  Images personally reviewed and interpreted.  Discussed with patient home care for chest wall pain.  Discussed OTC analgesics, outpatient follow-up and return precautions.        Final Clinical Impression(s) / ED Diagnoses Final diagnoses:  Chest wall pain    Rx / DC Orders ED Discharge Orders     None         07/26/2022, MD 07/24/22 2325

## 2022-07-24 NOTE — ED Triage Notes (Signed)
Epigastric pain since this morning. Also c/o LUQ abdominal pain. Denies n/v/d, cough, congestion, fevers.

## 2022-08-07 ENCOUNTER — Other Ambulatory Visit: Payer: Self-pay

## 2022-08-07 ENCOUNTER — Encounter (HOSPITAL_BASED_OUTPATIENT_CLINIC_OR_DEPARTMENT_OTHER): Payer: Self-pay | Admitting: Emergency Medicine

## 2022-08-07 DIAGNOSIS — D72829 Elevated white blood cell count, unspecified: Secondary | ICD-10-CM | POA: Diagnosis not present

## 2022-08-07 DIAGNOSIS — Z79899 Other long term (current) drug therapy: Secondary | ICD-10-CM | POA: Insufficient documentation

## 2022-08-07 DIAGNOSIS — U071 COVID-19: Secondary | ICD-10-CM | POA: Diagnosis not present

## 2022-08-07 DIAGNOSIS — M791 Myalgia, unspecified site: Secondary | ICD-10-CM | POA: Diagnosis present

## 2022-08-07 DIAGNOSIS — R1030 Lower abdominal pain, unspecified: Secondary | ICD-10-CM | POA: Insufficient documentation

## 2022-08-07 DIAGNOSIS — R112 Nausea with vomiting, unspecified: Secondary | ICD-10-CM | POA: Insufficient documentation

## 2022-08-07 LAB — CBC
HCT: 43.8 % (ref 39.0–52.0)
Hemoglobin: 14.8 g/dL (ref 13.0–17.0)
MCH: 27.9 pg (ref 26.0–34.0)
MCHC: 33.8 g/dL (ref 30.0–36.0)
MCV: 82.5 fL (ref 80.0–100.0)
Platelets: 148 10*3/uL — ABNORMAL LOW (ref 150–400)
RBC: 5.31 MIL/uL (ref 4.22–5.81)
RDW: 13.5 % (ref 11.5–15.5)
WBC: 11.2 10*3/uL — ABNORMAL HIGH (ref 4.0–10.5)
nRBC: 0 % (ref 0.0–0.2)

## 2022-08-07 LAB — COMPREHENSIVE METABOLIC PANEL
ALT: 9 U/L (ref 0–44)
AST: 18 U/L (ref 15–41)
Albumin: 4.1 g/dL (ref 3.5–5.0)
Alkaline Phosphatase: 66 U/L (ref 38–126)
Anion gap: 9 (ref 5–15)
BUN: 11 mg/dL (ref 6–20)
CO2: 22 mmol/L (ref 22–32)
Calcium: 8.8 mg/dL — ABNORMAL LOW (ref 8.9–10.3)
Chloride: 102 mmol/L (ref 98–111)
Creatinine, Ser: 0.72 mg/dL (ref 0.61–1.24)
GFR, Estimated: >60 mL/min (ref >60)
Glucose, Bld: 103 mg/dL — ABNORMAL HIGH (ref 70–99)
Potassium: 3.6 mmol/L (ref 3.5–5.1)
Sodium: 133 mmol/L — ABNORMAL LOW (ref 135–145)
Total Bilirubin: 1.1 mg/dL (ref 0.3–1.2)
Total Protein: 8.1 g/dL (ref 6.5–8.1)

## 2022-08-07 LAB — LIPASE, BLOOD: Lipase: 22 U/L (ref 11–51)

## 2022-08-07 NOTE — ED Triage Notes (Signed)
Patient arrived via POV c/o abdominal pain with emesis x 6 hrs. Patient states chest pain starting after emesis. Patient states 6 episodes of emesis. Patient is AO x 4, VS w/ elevated temp, slow gait.

## 2022-08-08 ENCOUNTER — Emergency Department (HOSPITAL_BASED_OUTPATIENT_CLINIC_OR_DEPARTMENT_OTHER): Payer: Self-pay

## 2022-08-08 ENCOUNTER — Emergency Department (HOSPITAL_BASED_OUTPATIENT_CLINIC_OR_DEPARTMENT_OTHER)
Admission: EM | Admit: 2022-08-08 | Discharge: 2022-08-08 | Disposition: A | Discharge status: Home / Self Care | Payer: Self-pay | Attending: Emergency Medicine | Admitting: Emergency Medicine

## 2022-08-08 DIAGNOSIS — R112 Nausea with vomiting, unspecified: Secondary | ICD-10-CM

## 2022-08-08 LAB — RAPID URINE DRUG SCREEN, HOSP PERFORMED
Amphetamines: NOT DETECTED
Barbiturates: NOT DETECTED
Benzodiazepines: NOT DETECTED
Cocaine: NOT DETECTED
Opiates: NOT DETECTED
Tetrahydrocannabinol: POSITIVE — AB

## 2022-08-08 LAB — URINALYSIS, ROUTINE W REFLEX MICROSCOPIC
Glucose, UA: NEGATIVE mg/dL
Hgb urine dipstick: NEGATIVE
Ketones, ur: >=80 mg/dL — AB
Leukocytes,Ua: NEGATIVE
Nitrite: NEGATIVE
Protein, ur: 100 mg/dL — AB
Specific Gravity, Urine: 1.025 (ref 1.005–1.030)
pH: 5.5 (ref 5.0–8.0)

## 2022-08-08 LAB — URINALYSIS, MICROSCOPIC (REFLEX)

## 2022-08-08 LAB — RESP PANEL BY RT-PCR (FLU A&B, COVID) ARPGX2
Influenza A by PCR: NEGATIVE
Influenza B by PCR: NEGATIVE
SARS Coronavirus 2 by RT PCR: POSITIVE — AB

## 2022-08-08 MED ORDER — METOCLOPRAMIDE HCL 10 MG PO TABS: 10.0000 mg | ORAL_TABLET | Freq: Four times a day (QID) | ORAL | 0 refills | Status: AC

## 2022-08-08 MED ORDER — ACETAMINOPHEN 325 MG PO TABS
650.0000 mg | ORAL_TABLET | Freq: Once | ORAL | Status: AC
  Administered 2022-08-08: 650 mg via ORAL
  Filled 2022-08-08: qty 2

## 2022-08-08 MED ORDER — IOHEXOL 300 MG/ML  SOLN
100.0000 mL | Freq: Once | INTRAMUSCULAR | Status: AC | PRN
  Administered 2022-08-08: 100 mL via INTRAVENOUS

## 2022-08-08 MED ORDER — SODIUM CHLORIDE 0.9 % IV BOLUS
1000.0000 mL | Freq: Once | INTRAVENOUS | Status: AC
  Administered 2022-08-08: 1000 mL via INTRAVENOUS

## 2022-08-08 MED ORDER — ONDANSETRON HCL 4 MG PO TABS: 4.0000 mg | ORAL_TABLET | Freq: Four times a day (QID) | ORAL | 0 refills | Status: AC

## 2022-08-08 MED ORDER — METOCLOPRAMIDE HCL 10 MG PO TABS: 10.0000 mg | ORAL_TABLET | Freq: Four times a day (QID) | ORAL | 0 refills | Status: DC

## 2022-08-08 MED ORDER — METOCLOPRAMIDE HCL 5 MG/ML IJ SOLN
10.0000 mg | Freq: Once | INTRAMUSCULAR | Status: AC
  Administered 2022-08-08: 10 mg via INTRAVENOUS
  Filled 2022-08-08: qty 2

## 2022-08-08 NOTE — ED Provider Notes (Signed)
[facility] EMERGENCY DEPARTMENT Provider Note   CSN: [id number] Arrival date & time: 08/07/22  2206     History  Chief Complaint  Patient presents with   Abdominal Pain    Curtis Paul is a 26 y.o. male.  HPI 26 year old male presenting with abdominal pain and nausea and vomiting.  Symptoms began earlier today while at work.  He complains of mid abdominal to lower abdominal pain.  He complains of body aches and chills.  He denies any dysuria or hematuria.  Denies any flank pain.  Emesis is nonbloody.  Unknown sick contacts.  Does not think he had any bad food exposures.  He was at work and was sent to the ER as he states that he started to feel very poorly.    Home Medications Prior to Admission medications   Medication Sig Start Date End Date Taking? Authorizing Provider  ondansetron (ZOFRAN) 4 MG tablet Take 1 tablet (4 mg total) by mouth every 6 (six) hours. 08/08/22  Yes Mare Ferrari, PA-C  acetaminophen (TYLENOL) 500 MG tablet Take 2 tablets (1,000 mg total) by mouth every 6 (six) hours as needed. 09/26/20   Barnetta Chapel, PA-C  bacitracin ointment Apply topically 2 (two) times daily. 09/26/20   Barnetta Chapel, PA-C  cholecalciferol (VITAMIN D) 25 MCG tablet Take 2 tablets (2,000 Units total) by mouth 2 (two) times daily. 09/26/20   Barnetta Chapel, PA-C  diphenhydrAMINE (BENADRYL) 25 MG tablet Take 1 tablet (25 mg total) by mouth every 6 (six) hours as needed. 01/19/20   Horton, Mayer Masker, MD  docusate sodium (COLACE) 100 MG capsule Take 1 capsule (100 mg total) by mouth 2 (two) times daily. 09/26/20   Barnetta Chapel, PA-C  EPINEPHrine 0.3 mg/0.3 mL IJ SOAJ injection Inject 0.3 mLs (0.3 mg total) into the muscle as needed for anaphylaxis. 01/19/20   Horton, Mayer Masker, MD  famotidine (PEPCID) 20 MG tablet Take 1 tablet (20 mg total) by mouth daily. 01/19/20   Horton, Mayer Masker, MD  ibuprofen (ADVIL) 600 MG tablet Take 1 tablet (600 mg total) by mouth every 6 (six)  hours as needed. 03/26/20   Dartha Lodge, PA-C  methocarbamol (ROBAXIN) 500 MG tablet Take 1 tablet (500 mg total) by mouth 2 (two) times daily. 03/26/20   Dartha Lodge, PA-C  methocarbamol (ROBAXIN) 500 MG tablet Take 2 tablets (1,000 mg total) by mouth every 8 (eight) hours as needed for muscle spasms. 09/26/20   Barnetta Chapel, PA-C  metoCLOPramide (REGLAN) 10 MG tablet Take 1 tablet (10 mg total) by mouth every 6 (six) hours for 3 days. 08/08/22 08/11/22  Mare Ferrari, PA-C  oxyCODONE (OXY IR/ROXICODONE) 5 MG immediate release tablet Take 1-2 tablets (5-10 mg total) by mouth every 4 (four) hours as needed. 09/26/20   Barnetta Chapel, PA-C  polyethylene glycol (MIRALAX / GLYCOLAX) 17 g packet Take 17 g by mouth daily. 09/26/20   Barnetta Chapel, PA-C  albuterol (PROVENTIL HFA;VENTOLIN HFA) 108 (90 Base) MCG/ACT inhaler Inhale 1-2 puffs into the lungs every 6 (six) hours as needed for wheezing or shortness of breath. 12/18/18 12/12/19  Petrucelli, Samantha R, PA-C      Allergies    Shellfish allergy and Shellfish allergy    Review of Systems   Review of Systems Ten systems reviewed and are negative for acute change, except as noted in the HPI.   Physical Exam Updated Vital Signs BP 127/86 (BP Location: Right Arm)   Pulse 100  Temp (!) 100.4 F (38 C) (Oral)   Resp 18   Ht 5\' 11"  (1.803 m)   Wt 80.7 kg   SpO2 98%   BMI 24.83 kg/m  Physical Exam Vitals and nursing note reviewed.  Constitutional:      General: He is not in acute distress.    Appearance: He is well-developed.  HENT:     Head: Normocephalic and atraumatic.  Eyes:     Conjunctiva/sclera: Conjunctivae normal.  Cardiovascular:     Rate and Rhythm: Normal rate and regular rhythm.     Heart sounds: No murmur heard. Pulmonary:     Effort: Pulmonary effort is normal. No respiratory distress.     Breath sounds: Normal breath sounds.  Abdominal:     Palpations: Abdomen is soft.     Tenderness: There is generalized  abdominal tenderness.     Comments: Mild to moderate mid abdominal to lower abdominal tenderness, negative McBurney's, no CVA tenderness, no guarding, no peritoneal signs  Musculoskeletal:        General: No swelling.     Cervical back: Neck supple.  Skin:    General: Skin is warm and dry.     Capillary Refill: Capillary refill takes less than 2 seconds.  Neurological:     Mental Status: He is alert.  Psychiatric:        Mood and Affect: Mood normal.     ED Results / Procedures / Treatments   Labs (all labs ordered are listed, but only abnormal results are displayed) Labs Reviewed  COMPREHENSIVE METABOLIC PANEL - Abnormal; Notable for the following components:      Result Value   Sodium 133 (*)    Glucose, Bld 103 (*)    Calcium 8.8 (*)    All other components within normal limits  CBC - Abnormal; Notable for the following components:   WBC 11.2 (*)    Platelets 148 (*)    All other components within normal limits  URINALYSIS, ROUTINE W REFLEX MICROSCOPIC - Abnormal; Notable for the following components:   APPearance HAZY (*)    Bilirubin Urine SMALL (*)    Ketones, ur >=80 (*)    Protein, ur 100 (*)    All other components within normal limits  URINALYSIS, MICROSCOPIC (REFLEX) - Abnormal; Notable for the following components:   Bacteria, UA FEW (*)    All other components within normal limits  RAPID URINE DRUG SCREEN, HOSP PERFORMED - Abnormal; Notable for the following components:   Tetrahydrocannabinol POSITIVE (*)    All other components within normal limits  RESP PANEL BY RT-PCR (FLU A&B, COVID) ARPGX2  LIPASE, BLOOD    EKG None  Radiology CT ABDOMEN PELVIS W CONTRAST  Result Date: 08/08/2022 CLINICAL DATA:  Abdominal pain. EXAM: CT ABDOMEN AND PELVIS WITH CONTRAST TECHNIQUE: Multidetector CT imaging of the abdomen and pelvis was performed using the standard protocol following bolus administration of intravenous contrast. RADIATION DOSE REDUCTION: This exam was  performed according to the departmental dose-optimization program which includes automated exposure control, adjustment of the mA and/or kV according to patient size and/or use of iterative reconstruction technique. CONTRAST:  10/08/2022 OMNIPAQUE IOHEXOL 300 MG/ML  SOLN COMPARISON:  CT abdomen pelvis dated 09/23/2020. FINDINGS: Evaluation of this exam is limited due to respiratory motion artifact. Lower chest: The visualized lung bases are clear. No intra-abdominal free air or free fluid. Hepatobiliary: No focal liver abnormality is seen. No gallstones, gallbladder wall thickening, or biliary dilatation. Pancreas: Unremarkable. No pancreatic ductal  dilatation or surrounding inflammatory changes. Spleen: Normal in size without focal abnormality. Adrenals/Urinary Tract: Adrenal glands are unremarkable. Kidneys are normal, without renal calculi, focal lesion, or hydronephrosis. Bladder is unremarkable. Stomach/Bowel: Evaluation of the bowel is limited in the absence of oral contrast and paucity of abdominal fat. There is no bowel obstruction or active inflammation. The appendix is normal. Vascular/Lymphatic: The abdominal aorta and IVC are unremarkable. No portal venous gas. There is no adenopathy. Reproductive: The prostate and seminal vesicles are grossly unremarkable. No pelvic mass. Other: None Musculoskeletal: Fixation hardware of the right pelvic bone. No acute osseous pathology. IMPRESSION: No acute intra-abdominal or pelvic pathology. Electronically Signed   By: Elgie Collard M.D.   On: 08/08/2022 03:22    Procedures Procedures    Medications Ordered in ED Medications  acetaminophen (TYLENOL) tablet 650 mg (650 mg Oral Given 08/08/22 0252)  metoCLOPramide (REGLAN) injection 10 mg (10 mg Intravenous Given 08/08/22 0250)  sodium chloride 0.9 % bolus 1,000 mL (1,000 mLs Intravenous New Bag/Given 08/08/22 0251)  iohexol (OMNIPAQUE) 300 MG/ML solution 100 mL (100 mLs Intravenous Contrast Given 08/08/22 0256)     ED Course/ Medical Decision Making/ A&P                           Medical Decision Making Amount and/or Complexity of Data Reviewed Labs: ordered. Radiology: ordered.  Risk OTC drugs. Prescription drug management.   26 year old male presenting with nausea, vomiting and abdominal pain.  On arrival, he is well-appearing, no acute distress, resting comfortably in the ER bed.  He is febrile with a temp 100.6 on arrival and mildly tachycardic with a rate of 103, not hypoxic, normotensive.  Physical exam with mild to moderate mid to lower abdominal tenderness, no focal tenderness, no peritoneal signs, no guarding, negative McBurney sign, negative CVA tenderness. Frontal diagnosis includes viral gastritis, appendicitis, COVID-19, cholecystitis, renal stone/colic, UTI, SBO, drug-related  Labs ordered, reviewed and interpreted by me.  CBC with leukocytosis of 11.2.  His CMP shows a mild hyponatremia 133, normal renal function, normal LFTs and bilirubin.  His UA shows small amount of bilirubin, ketonuria and proteinuria likely consistent with dehydration.  His lipase is normal.  His UDS is positive for THC.  I ordered, reviewed CT imaging of the abdomen to rule out appendicitis, cholecystitis, renal stone.  This was overall unremarkable and I agree with radiology read.  Patient was given IV fluids, Reglan and Tylenol for fever.  He tolerated p.o. fluids well.  Overall feeling better.   Given negative CT imaging, low suspicion for appendicitis, renal stone, cholecystitis, or any other acute abdominal pathology.   His COVID test is still pending, symptoms could be due to viral gastritis, possibly cannabis hyperemesis given positive THC, or COVID-19.  Plan to send home with antiemetics and Bentyl.  He was encouraged to follow-up on his COVID test via MyChart.  Discussed return precautions.  He voiced understanding and is agreeable.  Stable for discharge. Final Clinical Impression(s) / ED  Diagnoses Final diagnoses:  Nausea and vomiting, unspecified vomiting type    Rx / DC Orders ED Discharge Orders          Ordered    ondansetron (ZOFRAN) 4 MG tablet  Every 6 hours        08/08/22 0346    metoCLOPramide (REGLAN) 10 MG tablet  Every 6 hours,   Status:  Discontinued        08/08/22 0346  metoCLOPramide (REGLAN) 10 MG tablet  Every 6 hours        08/08/22 0346              Mare Ferrari, PA-C 08/08/22 0350    Tilden Fossa, MD 08/08/22 [phone]

## 2022-08-08 NOTE — ED Notes (Signed)
Patient recently returned to ED after LBWS after triage. Vitals updated at this time.

## 2022-08-08 NOTE — ED Notes (Signed)
Patient transported to CT 

## 2022-08-08 NOTE — Discharge Instructions (Addendum)
You were evaluated in the Emergency Department and after careful evaluation, we did not find any emergent condition requiring admission or further testing in the hospital.  Your work-up today was overall reassuring.  Your COVID test is still pending, please follow-up on this via the MyChart app.  There are instructions in your discharge paperwork on how to download this.  If this is positive you will need to quarantine for 5 to 7 days.  Please use Zofran for nausea and Bentyl as needed for abdominal pain.  Continue to drink plenty of fluids.  Your symptoms may be due to cannabis use, please try to limit this.  Please return to the Emergency Department if you experience any worsening of your condition.  We encourage you to follow up with a primary care provider.  Thank you for allowing [place] to be a part of your care.

## 2024-06-16 ENCOUNTER — Ambulatory Visit: Payer: Self-pay

## 2024-06-16 ENCOUNTER — Inpatient Hospital Stay
Admission: RE | Admit: 2024-06-16 | Discharge: 2024-06-16 | Discharge status: Home / Self Care | Payer: Self-pay | Source: Ambulatory Visit | Attending: Nurse Practitioner

## 2024-06-16 VITALS — BP 135/78 | HR 64 | Temp 98.0°F | Resp 16

## 2024-06-16 DIAGNOSIS — Z113 Encounter for screening for infections with a predominantly sexual mode of transmission: Secondary | ICD-10-CM | POA: Insufficient documentation

## 2024-06-16 NOTE — Discharge Instructions (Addendum)
 Testing for gonorrhea, chlamydia, trichomonas, HIV and syphilis is pending. You should not have any sexual activity until you receive the results of the tests. You will only be notified for positive results. You may go online to MyChart and review your results. Practice safe sex practices by wearing a condom every time you have sex. Remember that people who have STIs may not experience any symptoms. However, even without symptoms, these infections can be spread from person to person and require treatment. STIs can be treated, and many STIs can be cured. However, some STIs cannot be cured and will affect you for the rest of your life. It's important to be checked regularly for STIs. You should also consider taking pre-exposure prophylaxis (PrEP) to prevent HIV infection.
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
## 2024-06-16 NOTE — ED Triage Notes (Signed)
 Pt presents for STD testing. He would also like blood work. Denies symptoms

## 2024-06-17 LAB — CYTOLOGY, (ORAL, ANAL, URETHRAL) ANCILLARY ONLY
Chlamydia: POSITIVE — AB
Comment: NEGATIVE
Comment: NEGATIVE
Comment: NORMAL
Neisseria Gonorrhea: NEGATIVE
Trichomonas: NEGATIVE

## 2024-06-17 LAB — HIV ANTIBODY (ROUTINE TESTING W REFLEX): HIV Screen 4th Generation wRfx: NONREACTIVE

## 2024-06-17 LAB — RPR: RPR Ser Ql: NONREACTIVE

## 2024-06-20 ENCOUNTER — Ambulatory Visit (HOSPITAL_COMMUNITY): Payer: Self-pay

## 2024-06-23 MED ORDER — DOXYCYCLINE HYCLATE 100 MG PO TABS: 100.0000 mg | ORAL_TABLET | Freq: Two times a day (BID) | ORAL | 0 refills | Status: AC

## 2024-06-23 NOTE — Telephone Encounter (Signed)
 Contacted patient by phone.  Verified identity using two identifiers.  Provided positive result.  Reviewed safe sex practices, notifying partners, and refraining from sexual activities for 7 days from time of treatment.  Patient verified understanding, all questions answered.

## 2024-06-25 ENCOUNTER — Encounter (HOSPITAL_COMMUNITY): Payer: Self-pay
